# Patient Record
Sex: Male | Born: 1965 | Race: White | Hispanic: No | Marital: Single | Smoking: Never smoker
Health system: Southern US, Community
[De-identification: ages and names within clinical notes are randomized; demographics above are authoritative.]

## PROBLEM LIST (undated history)

## (undated) DIAGNOSIS — J189 Pneumonia, unspecified organism: Secondary | ICD-10-CM

## (undated) HISTORY — PX: ANTERIOR CRUCIATE LIGAMENT REPAIR: SHX115

## (undated) HISTORY — PX: EYE SURGERY: SHX253

---

## 1997-10-31 ENCOUNTER — Emergency Department (HOSPITAL_COMMUNITY): Admission: EM | Admit: 1997-10-31 | Discharge: 1997-10-31 | Payer: Self-pay | Admitting: Emergency Medicine

## 2004-11-20 ENCOUNTER — Emergency Department (HOSPITAL_COMMUNITY): Admission: EM | Admit: 2004-11-20 | Discharge: 2004-11-20 | Payer: Self-pay | Admitting: Emergency Medicine

## 2005-03-04 ENCOUNTER — Emergency Department (HOSPITAL_COMMUNITY): Admission: EM | Admit: 2005-03-04 | Discharge: 2005-03-04 | Payer: Self-pay | Admitting: Emergency Medicine

## 2007-04-19 ENCOUNTER — Emergency Department (HOSPITAL_COMMUNITY): Admission: EM | Admit: 2007-04-19 | Discharge: 2007-04-19 | Payer: Self-pay | Admitting: Emergency Medicine

## 2011-02-16 LAB — I-STAT 8, (EC8 V) (CONVERTED LAB)
BUN: 10
Bicarbonate: 26 — ABNORMAL HIGH
Chloride: 109
Glucose, Bld: 93
HCT: 51
Hemoglobin: 17.3 — ABNORMAL HIGH
Operator id: 234501
Potassium: 4.2
Sodium: 142
TCO2: 27
pCO2, Ven: 44 — ABNORMAL LOW
pH, Ven: 7.379 — ABNORMAL HIGH

## 2011-02-16 LAB — POCT I-STAT CREATININE
Creatinine, Ser: 1
Operator id: 234501

## 2016-03-21 ENCOUNTER — Emergency Department (HOSPITAL_COMMUNITY): Payer: No Typology Code available for payment source

## 2016-03-21 ENCOUNTER — Inpatient Hospital Stay (HOSPITAL_COMMUNITY): Payer: No Typology Code available for payment source

## 2016-03-21 ENCOUNTER — Encounter (HOSPITAL_COMMUNITY): Payer: Self-pay | Admitting: Neurology

## 2016-03-21 ENCOUNTER — Encounter (HOSPITAL_COMMUNITY): Admission: EM | Disposition: A | Discharge status: Rehabilitation Facility | Payer: Self-pay | Source: Home / Self Care

## 2016-03-21 ENCOUNTER — Inpatient Hospital Stay (HOSPITAL_COMMUNITY)
Admission: EM | Admit: 2016-03-21 | Discharge: 2016-04-01 | DRG: 957 | Disposition: A | Discharge status: Rehabilitation Facility | Payer: No Typology Code available for payment source | Attending: General Surgery | Admitting: General Surgery

## 2016-03-21 ENCOUNTER — Inpatient Hospital Stay (HOSPITAL_COMMUNITY): Payer: No Typology Code available for payment source | Admitting: Certified Registered"

## 2016-03-21 DIAGNOSIS — S0285XA Fracture of orbit, unspecified, initial encounter for closed fracture: Secondary | ICD-10-CM | POA: Diagnosis present

## 2016-03-21 DIAGNOSIS — K661 Hemoperitoneum: Secondary | ICD-10-CM | POA: Diagnosis present

## 2016-03-21 DIAGNOSIS — S82401B Unspecified fracture of shaft of right fibula, initial encounter for open fracture type I or II: Secondary | ICD-10-CM

## 2016-03-21 DIAGNOSIS — S0231XS Fracture of orbital floor, right side, sequela: Secondary | ICD-10-CM | POA: Diagnosis not present

## 2016-03-21 DIAGNOSIS — H532 Diplopia: Secondary | ICD-10-CM | POA: Diagnosis present

## 2016-03-21 DIAGNOSIS — G8918 Other acute postprocedural pain: Secondary | ICD-10-CM

## 2016-03-21 DIAGNOSIS — S36409A Unspecified injury of unspecified part of small intestine, initial encounter: Secondary | ICD-10-CM | POA: Diagnosis present

## 2016-03-21 DIAGNOSIS — M62838 Other muscle spasm: Secondary | ICD-10-CM | POA: Diagnosis present

## 2016-03-21 DIAGNOSIS — E876 Hypokalemia: Secondary | ICD-10-CM | POA: Diagnosis present

## 2016-03-21 DIAGNOSIS — R571 Hypovolemic shock: Secondary | ICD-10-CM | POA: Diagnosis present

## 2016-03-21 DIAGNOSIS — R609 Edema, unspecified: Secondary | ICD-10-CM | POA: Diagnosis not present

## 2016-03-21 DIAGNOSIS — S82871D Displaced pilon fracture of right tibia, subsequent encounter for closed fracture with routine healing: Secondary | ICD-10-CM | POA: Diagnosis not present

## 2016-03-21 DIAGNOSIS — Z6841 Body Mass Index (BMI) 40.0 and over, adult: Secondary | ICD-10-CM | POA: Diagnosis not present

## 2016-03-21 DIAGNOSIS — L299 Pruritus, unspecified: Secondary | ICD-10-CM | POA: Diagnosis present

## 2016-03-21 DIAGNOSIS — T1490XA Injury, unspecified, initial encounter: Secondary | ICD-10-CM | POA: Diagnosis not present

## 2016-03-21 DIAGNOSIS — S82871A Displaced pilon fracture of right tibia, initial encounter for closed fracture: Secondary | ICD-10-CM

## 2016-03-21 DIAGNOSIS — D62 Acute posthemorrhagic anemia: Secondary | ICD-10-CM | POA: Diagnosis present

## 2016-03-21 DIAGNOSIS — K567 Ileus, unspecified: Secondary | ICD-10-CM | POA: Diagnosis present

## 2016-03-21 DIAGNOSIS — S0231XA Fracture of orbital floor, right side, initial encounter for closed fracture: Secondary | ICD-10-CM | POA: Diagnosis present

## 2016-03-21 DIAGNOSIS — S01111A Laceration without foreign body of right eyelid and periocular area, initial encounter: Secondary | ICD-10-CM | POA: Diagnosis present

## 2016-03-21 DIAGNOSIS — S0230XD Fracture of orbital floor, unspecified side, subsequent encounter for fracture with routine healing: Secondary | ICD-10-CM | POA: Diagnosis present

## 2016-03-21 DIAGNOSIS — Z9889 Other specified postprocedural states: Secondary | ICD-10-CM

## 2016-03-21 DIAGNOSIS — S36113A Laceration of liver, unspecified degree, initial encounter: Secondary | ICD-10-CM | POA: Diagnosis present

## 2016-03-21 DIAGNOSIS — S82871S Displaced pilon fracture of right tibia, sequela: Secondary | ICD-10-CM | POA: Diagnosis present

## 2016-03-21 DIAGNOSIS — S82401E Unspecified fracture of shaft of right fibula, subsequent encounter for open fracture type I or II with routine healing: Secondary | ICD-10-CM | POA: Diagnosis not present

## 2016-03-21 DIAGNOSIS — Z87891 Personal history of nicotine dependence: Secondary | ICD-10-CM | POA: Diagnosis not present

## 2016-03-21 DIAGNOSIS — S36113D Laceration of liver, unspecified degree, subsequent encounter: Secondary | ICD-10-CM | POA: Diagnosis not present

## 2016-03-21 DIAGNOSIS — S82831B Other fracture of upper and lower end of right fibula, initial encounter for open fracture type I or II: Secondary | ICD-10-CM | POA: Diagnosis present

## 2016-03-21 DIAGNOSIS — R0902 Hypoxemia: Secondary | ICD-10-CM | POA: Diagnosis present

## 2016-03-21 DIAGNOSIS — Z967 Presence of other bone and tendon implants: Secondary | ICD-10-CM | POA: Diagnosis not present

## 2016-03-21 DIAGNOSIS — L282 Other prurigo: Secondary | ICD-10-CM

## 2016-03-21 DIAGNOSIS — R05 Cough: Secondary | ICD-10-CM

## 2016-03-21 DIAGNOSIS — Z8781 Personal history of (healed) traumatic fracture: Secondary | ICD-10-CM | POA: Diagnosis not present

## 2016-03-21 DIAGNOSIS — S82871B Displaced pilon fracture of right tibia, initial encounter for open fracture type I or II: Secondary | ICD-10-CM | POA: Diagnosis present

## 2016-03-21 DIAGNOSIS — S35229A Unspecified injury of superior mesenteric artery, initial encounter: Secondary | ICD-10-CM | POA: Diagnosis present

## 2016-03-21 DIAGNOSIS — S82201B Unspecified fracture of shaft of right tibia, initial encounter for open fracture type I or II: Secondary | ICD-10-CM

## 2016-03-21 DIAGNOSIS — S0511XD Contusion of eyeball and orbital tissues, right eye, subsequent encounter: Secondary | ICD-10-CM | POA: Diagnosis not present

## 2016-03-21 DIAGNOSIS — R059 Cough, unspecified: Secondary | ICD-10-CM

## 2016-03-21 DIAGNOSIS — Z419 Encounter for procedure for purposes other than remedying health state, unspecified: Secondary | ICD-10-CM

## 2016-03-21 DIAGNOSIS — R030 Elevated blood-pressure reading, without diagnosis of hypertension: Secondary | ICD-10-CM | POA: Diagnosis present

## 2016-03-21 DIAGNOSIS — L03316 Cellulitis of umbilicus: Secondary | ICD-10-CM | POA: Diagnosis present

## 2016-03-21 DIAGNOSIS — L0390 Cellulitis, unspecified: Secondary | ICD-10-CM

## 2016-03-21 DIAGNOSIS — T8189XD Other complications of procedures, not elsewhere classified, subsequent encounter: Secondary | ICD-10-CM | POA: Diagnosis not present

## 2016-03-21 DIAGNOSIS — L03818 Cellulitis of other sites: Secondary | ICD-10-CM | POA: Diagnosis not present

## 2016-03-21 HISTORY — PX: I&D EXTREMITY: SHX5045

## 2016-03-21 HISTORY — PX: BOWEL RESECTION: SHX1257

## 2016-03-21 HISTORY — PX: LAPAROTOMY: SHX154

## 2016-03-21 HISTORY — PX: EXTERNAL FIXATION LEG: SHX1549

## 2016-03-21 LAB — COMPREHENSIVE METABOLIC PANEL
ALT: 55 U/L (ref 17–63)
AST: 50 U/L — ABNORMAL HIGH (ref 15–41)
Albumin: 3.9 g/dL (ref 3.5–5.0)
Alkaline Phosphatase: 64 U/L (ref 38–126)
Anion gap: 12 (ref 5–15)
BUN: 11 mg/dL (ref 6–20)
CO2: 24 mmol/L (ref 22–32)
Calcium: 9 mg/dL (ref 8.9–10.3)
Chloride: 104 mmol/L (ref 101–111)
Creatinine, Ser: 1.08 mg/dL (ref 0.61–1.24)
GFR calc Af Amer: >60 mL/min (ref >60)
GFR calc non Af Amer: >60 mL/min (ref >60)
GLUCOSE: 132 mg/dL — AB (ref 65–99)
POTASSIUM: 3.1 mmol/L — AB (ref 3.5–5.1)
Sodium: 140 mmol/L (ref 135–145)
Total Bilirubin: 0.7 mg/dL (ref 0.3–1.2)
Total Protein: 7 g/dL (ref 6.5–8.1)

## 2016-03-21 LAB — I-STAT CHEM 8, ED
BUN: 13 mg/dL (ref 6–20)
CHLORIDE: 103 mmol/L (ref 101–111)
Calcium, Ion: 1.11 mmol/L — ABNORMAL LOW (ref 1.15–1.40)
Creatinine, Ser: 0.9 mg/dL (ref 0.61–1.24)
Glucose, Bld: 125 mg/dL — ABNORMAL HIGH (ref 65–99)
HCT: 45 % (ref 39.0–52.0)
Hemoglobin: 15.3 g/dL (ref 13.0–17.0)
Potassium: 3.1 mmol/L — ABNORMAL LOW (ref 3.5–5.1)
Sodium: 142 mmol/L (ref 135–145)
TCO2: 24 mmol/L (ref 0–100)

## 2016-03-21 LAB — POCT I-STAT 7, (LYTES, BLD GAS, ICA,H+H)
ACID-BASE DEFICIT: 10 mmol/L — AB (ref 0.0–2.0)
ACID-BASE DEFICIT: 4 mmol/L — AB (ref 0.0–2.0)
BICARBONATE: 18.8 mmol/L — AB (ref 20.0–28.0)
BICARBONATE: 23.4 mmol/L (ref 20.0–28.0)
CALCIUM ION: 1 mmol/L — AB (ref 1.15–1.40)
Calcium, Ion: 1.07 mmol/L — ABNORMAL LOW (ref 1.15–1.40)
HCT: 30 % — ABNORMAL LOW (ref 39.0–52.0)
HCT: 26 % — ABNORMAL LOW (ref 39.0–52.0)
Hemoglobin: 10.2 g/dL — ABNORMAL LOW (ref 13.0–17.0)
Hemoglobin: 8.8 g/dL — ABNORMAL LOW (ref 13.0–17.0)
O2 SAT: 100 %
O2 Saturation: 100 %
PCO2 ART: 55.7 mmHg — AB (ref 32.0–48.0)
PH ART: 7.138 — AB (ref 7.350–7.450)
PH ART: 7.265 — AB (ref 7.350–7.450)
PO2 ART: 345 mmHg — AB (ref 83.0–108.0)
Potassium: 4 mmol/L (ref 3.5–5.1)
Potassium: 4.1 mmol/L (ref 3.5–5.1)
SODIUM: 142 mmol/L (ref 135–145)
Sodium: 140 mmol/L (ref 135–145)
TCO2: 21 mmol/L (ref 0–100)
TCO2: 25 mmol/L (ref 0–100)
pCO2 arterial: 50.8 mmHg — ABNORMAL HIGH (ref 32.0–48.0)
pO2, Arterial: 359 mmHg — ABNORMAL HIGH (ref 83.0–108.0)

## 2016-03-21 LAB — CDS SEROLOGY

## 2016-03-21 LAB — CBC
HCT: 43.4 % (ref 39.0–52.0)
HEMOGLOBIN: 14.7 g/dL (ref 13.0–17.0)
MCH: 30.8 pg (ref 26.0–34.0)
MCHC: 33.9 g/dL (ref 30.0–36.0)
MCV: 90.8 fL (ref 78.0–100.0)
Platelets: 341 10*3/uL (ref 150–400)
RBC: 4.78 MIL/uL (ref 4.22–5.81)
RDW: 13.5 % (ref 11.5–15.5)
WBC: 15.3 10*3/uL — AB (ref 4.0–10.5)

## 2016-03-21 LAB — HEMOGLOBIN AND HEMATOCRIT, BLOOD
HCT: 33.1 % — ABNORMAL LOW (ref 39.0–52.0)
Hemoglobin: 11.1 g/dL — ABNORMAL LOW (ref 13.0–17.0)

## 2016-03-21 LAB — PROTIME-INR
INR: 1.01
Prothrombin Time: 13.3 seconds (ref 11.4–15.2)

## 2016-03-21 LAB — I-STAT CG4 LACTIC ACID, ED: Lactic Acid, Venous: 5.23 mmol/L (ref 0.5–1.9)

## 2016-03-21 LAB — ABO/RH: ABO/RH(D): A POS

## 2016-03-21 SURGERY — LAPAROTOMY, EXPLORATORY
Anesthesia: General | Site: Leg Lower | Laterality: Right

## 2016-03-21 MED ORDER — CEFAZOLIN SODIUM-DEXTROSE 2-4 GM/100ML-% IV SOLN
2.0000 g | Freq: Once | INTRAVENOUS | Status: AC
  Administered 2016-03-21: 2 g via INTRAVENOUS

## 2016-03-21 MED ORDER — ONDANSETRON HCL 4 MG/2ML IJ SOLN
INTRAMUSCULAR | Status: AC
  Filled 2016-03-21: qty 2

## 2016-03-21 MED ORDER — CEFAZOLIN IN D5W 1 GM/50ML IV SOLN
1.0000 g | Freq: Four times a day (QID) | INTRAVENOUS | Status: DC
  Administered 2016-03-22 – 2016-03-27 (×20): 1 g via INTRAVENOUS
  Filled 2016-03-21 (×25): qty 50

## 2016-03-21 MED ORDER — HYDROMORPHONE 1 MG/ML IV SOLN
INTRAVENOUS | Status: AC
  Filled 2016-03-21: qty 25

## 2016-03-21 MED ORDER — ETOMIDATE 2 MG/ML IV SOLN
INTRAVENOUS | Status: DC | PRN
  Administered 2016-03-21: 20 mg via INTRAVENOUS

## 2016-03-21 MED ORDER — SUCCINYLCHOLINE CHLORIDE 200 MG/10ML IV SOSY
PREFILLED_SYRINGE | INTRAVENOUS | Status: AC
  Filled 2016-03-21: qty 10

## 2016-03-21 MED ORDER — GLYCOPYRROLATE 0.2 MG/ML IJ SOLN
INTRAMUSCULAR | Status: DC | PRN
  Administered 2016-03-21: .6 mg via INTRAVENOUS

## 2016-03-21 MED ORDER — DIPHENHYDRAMINE HCL 12.5 MG/5ML PO ELIX: 12.5000 mg | ORAL_SOLUTION | Freq: Four times a day (QID) | ORAL | Status: DC | PRN

## 2016-03-21 MED ORDER — FENTANYL CITRATE (PF) 100 MCG/2ML IJ SOLN
INTRAMUSCULAR | Status: AC
  Filled 2016-03-21: qty 2

## 2016-03-21 MED ORDER — CEFAZOLIN IN D5W 1 GM/50ML IV SOLN
INTRAVENOUS | Status: DC | PRN
  Administered 2016-03-21: 1 g via INTRAVENOUS

## 2016-03-21 MED ORDER — LIDOCAINE 2% (20 MG/ML) 5 ML SYRINGE
INTRAMUSCULAR | Status: AC
  Filled 2016-03-21: qty 5

## 2016-03-21 MED ORDER — HYDROMORPHONE 1 MG/ML IV SOLN
INTRAVENOUS | Status: DC
Start: 2016-03-22 — End: 2016-03-24
  Administered 2016-03-22: 1.2 mg via INTRAVENOUS
  Administered 2016-03-22: 1.8 mg via INTRAVENOUS
  Administered 2016-03-22 (×2): 0.3 mg via INTRAVENOUS
  Administered 2016-03-22: 3.7 mg via INTRAVENOUS
  Administered 2016-03-22: 0.3 mg via INTRAVENOUS
  Administered 2016-03-22: 1.8 mg via INTRAVENOUS
  Administered 2016-03-23: 1.5 mg via INTRAVENOUS
  Administered 2016-03-23: 0 mg via INTRAVENOUS
  Administered 2016-03-23: 0.3 mg via INTRAVENOUS
  Administered 2016-03-23: 1.8 mg via INTRAVENOUS
  Administered 2016-03-23: via INTRAVENOUS
  Administered 2016-03-23: 0.8 mg via INTRAVENOUS
  Administered 2016-03-24: 1.2 mg via INTRAVENOUS
  Administered 2016-03-24: 0.3 mg via INTRAVENOUS
  Filled 2016-03-21: qty 25

## 2016-03-21 MED ORDER — NEOSTIGMINE METHYLSULFATE 5 MG/5ML IV SOSY
PREFILLED_SYRINGE | INTRAVENOUS | Status: AC
  Filled 2016-03-21: qty 5

## 2016-03-21 MED ORDER — ACETAMINOPHEN 325 MG PO TABS: 650.0000 mg | ORAL_TABLET | ORAL | Status: DC | PRN

## 2016-03-21 MED ORDER — SODIUM BICARBONATE 8.4 % IV SOLN
INTRAVENOUS | Status: AC
  Filled 2016-03-21: qty 50

## 2016-03-21 MED ORDER — NALOXONE HCL 0.4 MG/ML IJ SOLN: 0.4000 mg | INTRAMUSCULAR | Status: DC | PRN

## 2016-03-21 MED ORDER — SODIUM CHLORIDE 0.9 % IV BOLUS (SEPSIS)
1000.0000 mL | Freq: Once | INTRAVENOUS | Status: AC
  Administered 2016-03-21: 1000 mL via INTRAVENOUS

## 2016-03-21 MED ORDER — HYDROMORPHONE HCL 2 MG/ML IJ SOLN
1.0000 mg | Freq: Once | INTRAMUSCULAR | Status: AC
  Administered 2016-03-21: 1 mg via INTRAVENOUS
  Filled 2016-03-21: qty 1

## 2016-03-21 MED ORDER — IOPAMIDOL (ISOVUE-300) INJECTION 61%
INTRAVENOUS | Status: AC
  Administered 2016-03-21: 100 mL
  Filled 2016-03-21: qty 100

## 2016-03-21 MED ORDER — ONDANSETRON HCL 4 MG PO TABS: 4.0000 mg | ORAL_TABLET | Freq: Four times a day (QID) | ORAL | Status: DC | PRN

## 2016-03-21 MED ORDER — SODIUM CHLORIDE 0.9 % IR SOLN
Status: DC | PRN
  Administered 2016-03-21: 3000 mL

## 2016-03-21 MED ORDER — ALBUMIN HUMAN 5 % IV SOLN
INTRAVENOUS | Status: DC | PRN
  Administered 2016-03-21: 23:00:00 via INTRAVENOUS

## 2016-03-21 MED ORDER — ONDANSETRON HCL 4 MG/2ML IJ SOLN
INTRAMUSCULAR | Status: DC | PRN
  Administered 2016-03-21: 4 mg via INTRAVENOUS

## 2016-03-21 MED ORDER — DOCUSATE SODIUM 100 MG PO CAPS
100.0000 mg | ORAL_CAPSULE | Freq: Two times a day (BID) | ORAL | Status: DC
  Administered 2016-03-22 – 2016-03-25 (×6): 100 mg via ORAL
  Filled 2016-03-21 (×7): qty 1

## 2016-03-21 MED ORDER — PROMETHAZINE HCL 25 MG/ML IJ SOLN
6.2500 mg | INTRAMUSCULAR | Status: DC | PRN
Start: 2016-03-21 — End: 2016-03-22
  Administered 2016-03-22: 6.25 mg via INTRAVENOUS

## 2016-03-21 MED ORDER — ETOMIDATE 2 MG/ML IV SOLN
INTRAVENOUS | Status: AC
  Filled 2016-03-21: qty 10

## 2016-03-21 MED ORDER — SODIUM CHLORIDE 0.9 % IV SOLN: INTRAVENOUS | Status: DC | PRN

## 2016-03-21 MED ORDER — GLYCOPYRROLATE 0.2 MG/ML IV SOSY
PREFILLED_SYRINGE | INTRAVENOUS | Status: AC
  Filled 2016-03-21: qty 3

## 2016-03-21 MED ORDER — SODIUM CHLORIDE 0.9 % IV SOLN
INTRAVENOUS | Status: DC | PRN
Start: 2016-03-21 — End: 2016-03-21
  Administered 2016-03-21: 21:00:00 via INTRAVENOUS

## 2016-03-21 MED ORDER — CEFAZOLIN SODIUM 1 G IJ SOLR
INTRAMUSCULAR | Status: AC
  Filled 2016-03-21: qty 30

## 2016-03-21 MED ORDER — LIDOCAINE HCL (CARDIAC) 20 MG/ML IV SOLN
INTRAVENOUS | Status: DC | PRN
  Administered 2016-03-21: 100 mg via INTRATRACHEAL

## 2016-03-21 MED ORDER — NEOSTIGMINE METHYLSULFATE 10 MG/10ML IV SOLN
INTRAVENOUS | Status: DC | PRN
  Administered 2016-03-21: 5 mg via INTRAVENOUS

## 2016-03-21 MED ORDER — SUCCINYLCHOLINE CHLORIDE 20 MG/ML IJ SOLN
INTRAMUSCULAR | Status: DC | PRN
  Administered 2016-03-21: 120 mg via INTRAVENOUS

## 2016-03-21 MED ORDER — DIPHENHYDRAMINE HCL 50 MG/ML IJ SOLN
12.5000 mg | Freq: Four times a day (QID) | INTRAMUSCULAR | Status: DC | PRN
  Administered 2016-03-22: 12.5 mg via INTRAVENOUS
  Filled 2016-03-21: qty 1

## 2016-03-21 MED ORDER — TETANUS-DIPHTH-ACELL PERTUSSIS 5-2.5-18.5 LF-MCG/0.5 IM SUSP
0.5000 mL | Freq: Once | INTRAMUSCULAR | Status: AC
  Administered 2016-03-21: 0.5 mL via INTRAMUSCULAR

## 2016-03-21 MED ORDER — 0.9 % SODIUM CHLORIDE (POUR BTL) OPTIME
TOPICAL | Status: DC | PRN
  Administered 2016-03-21: 5000 mL
  Administered 2016-03-21: 1000 mL

## 2016-03-21 MED ORDER — SODIUM CHLORIDE 0.9 % IV BOLUS (SEPSIS)
1000.0000 mL | Freq: Once | INTRAVENOUS | Status: AC
Start: 2016-03-21 — End: 2016-03-21
  Administered 2016-03-21: 1000 mL via INTRAVENOUS

## 2016-03-21 MED ORDER — MORPHINE SULFATE (PF) 4 MG/ML IV SOLN: 2.0000 mg | INTRAVENOUS | Status: DC | PRN

## 2016-03-21 MED ORDER — CEFAZOLIN SODIUM-DEXTROSE 2-4 GM/100ML-% IV SOLN
INTRAVENOUS | Status: AC
  Filled 2016-03-21: qty 100

## 2016-03-21 MED ORDER — LACTATED RINGERS IV SOLN
INTRAVENOUS | Status: DC | PRN
  Administered 2016-03-21 (×2): via INTRAVENOUS

## 2016-03-21 MED ORDER — PROPOFOL 10 MG/ML IV BOLUS
INTRAVENOUS | Status: AC
  Filled 2016-03-21: qty 20

## 2016-03-21 MED ORDER — ONDANSETRON HCL 4 MG/2ML IJ SOLN: 4.0000 mg | Freq: Four times a day (QID) | INTRAMUSCULAR | Status: DC | PRN

## 2016-03-21 MED ORDER — SODIUM BICARBONATE 8.4 % IV SOLN
INTRAVENOUS | Status: DC | PRN
  Administered 2016-03-21: 50 meq via INTRAVENOUS

## 2016-03-21 MED ORDER — TETANUS-DIPHTH-ACELL PERTUSSIS 5-2.5-18.5 LF-MCG/0.5 IM SUSP
INTRAMUSCULAR | Status: AC
  Filled 2016-03-21: qty 0.5

## 2016-03-21 MED ORDER — FENTANYL CITRATE (PF) 100 MCG/2ML IJ SOLN
INTRAMUSCULAR | Status: DC | PRN
  Administered 2016-03-21 (×4): 50 ug via INTRAVENOUS

## 2016-03-21 MED ORDER — POVIDONE-IODINE 10 % OINT PACKET
TOPICAL_OINTMENT | CUTANEOUS | Status: DC | PRN
  Administered 2016-03-21: 1 via TOPICAL

## 2016-03-21 MED ORDER — POVIDONE-IODINE 10 % EX OINT
TOPICAL_OINTMENT | CUTANEOUS | Status: AC
  Filled 2016-03-21: qty 28.35

## 2016-03-21 MED ORDER — SODIUM CHLORIDE 0.9% FLUSH: 9.0000 mL | INTRAVENOUS | Status: DC | PRN

## 2016-03-21 MED ORDER — ROCURONIUM BROMIDE 100 MG/10ML IV SOLN
INTRAVENOUS | Status: DC | PRN
  Administered 2016-03-21: 50 mg via INTRAVENOUS

## 2016-03-21 MED ORDER — ROCURONIUM BROMIDE 10 MG/ML (PF) SYRINGE
PREFILLED_SYRINGE | INTRAVENOUS | Status: AC
  Filled 2016-03-21: qty 10

## 2016-03-21 MED ORDER — FENTANYL CITRATE (PF) 100 MCG/2ML IJ SOLN
25.0000 ug | INTRAMUSCULAR | Status: DC | PRN
  Administered 2016-03-21 (×2): 50 ug via INTRAVENOUS

## 2016-03-21 SURGICAL SUPPLY — 64 items
BANDAGE ACE 4X5 VEL STRL LF (GAUZE/BANDAGES/DRESSINGS) ×4 IMPLANT
BANDAGE ACE 6X5 VEL STRL LF (GAUZE/BANDAGES/DRESSINGS) ×4 IMPLANT
BAR GLASS FIBER EXFX 11X300 (EXFIX) ×8 IMPLANT
BLADE SURG ROTATE 9660 (MISCELLANEOUS) IMPLANT
CANISTER SUCTION 2500CC (MISCELLANEOUS) ×4 IMPLANT
CHLORAPREP W/TINT 26ML (MISCELLANEOUS) ×4 IMPLANT
CLAMP BLUE BAR TO PIN (EXFIX) ×8 IMPLANT
CLAMP PIN 2 BAR 45MM EXFIX (EXFIX) ×4 IMPLANT
COVER SURGICAL LIGHT HANDLE (MISCELLANEOUS) ×4 IMPLANT
DRAPE C-ARMOR (DRAPES) ×4 IMPLANT
DRAPE LAPAROSCOPIC ABDOMINAL (DRAPES) ×12 IMPLANT
DRAPE WARM FLUID 44X44 (DRAPE) ×4 IMPLANT
DRSG MEPILEX BORDER 4X4 (GAUZE/BANDAGES/DRESSINGS) ×4 IMPLANT
DRSG OPSITE POSTOP 4X10 (GAUZE/BANDAGES/DRESSINGS) ×4 IMPLANT
DRSG OPSITE POSTOP 4X8 (GAUZE/BANDAGES/DRESSINGS) ×4 IMPLANT
ELECT BLADE 6.5 EXT (BLADE) IMPLANT
ELECT CAUTERY BLADE 6.4 (BLADE) ×4 IMPLANT
ELECT REM PT RETURN 9FT ADLT (ELECTROSURGICAL) ×4
ELECTRODE REM PT RTRN 9FT ADLT (ELECTROSURGICAL) ×2 IMPLANT
GAUZE XEROFORM 5X9 LF (GAUZE/BANDAGES/DRESSINGS) ×4 IMPLANT
GLOVE BIO SURGEON STRL SZ7.5 (GLOVE) ×8 IMPLANT
GLOVE BIOGEL PI IND STRL 6.5 (GLOVE) ×6 IMPLANT
GLOVE BIOGEL PI IND STRL 8 (GLOVE) ×4 IMPLANT
GLOVE BIOGEL PI INDICATOR 6.5 (GLOVE) ×6
GLOVE BIOGEL PI INDICATOR 8 (GLOVE) ×4
GLOVE ECLIPSE 7.5 STRL STRAW (GLOVE) ×8 IMPLANT
GOWN STRL REUS W/ TWL LRG LVL3 (GOWN DISPOSABLE) ×4 IMPLANT
GOWN STRL REUS W/TWL LRG LVL3 (GOWN DISPOSABLE) ×4
HANDPIECE INTERPULSE COAX TIP (DISPOSABLE) ×2
KIT BASIN OR (CUSTOM PROCEDURE TRAY) ×4 IMPLANT
KIT ROOM TURNOVER OR (KITS) ×4 IMPLANT
NS IRRIG 1000ML POUR BTL (IV SOLUTION) ×8 IMPLANT
PACK GENERAL/GYN (CUSTOM PROCEDURE TRAY) ×4 IMPLANT
PACK ORTHO EXTREMITY (CUSTOM PROCEDURE TRAY) ×4 IMPLANT
PAD ARMBOARD 7.5X6 YLW CONV (MISCELLANEOUS) ×4 IMPLANT
PAD CAST 4YDX4 CTTN HI CHSV (CAST SUPPLIES) ×4 IMPLANT
PADDING CAST COTTON 4X4 STRL (CAST SUPPLIES) ×4
PIN HALF YELLOW 5X160X35 (EXFIX) ×8 IMPLANT
PIN TRANSFIXING 5.0 (EXFIX) ×4 IMPLANT
RELOAD PROXIMATE 75MM BLUE (ENDOMECHANICALS) ×8 IMPLANT
SEALER TISSUE X1 CVD JAW (INSTRUMENTS) IMPLANT
SEPRAFILM PROCEDURAL PACK 3X5 (MISCELLANEOUS) IMPLANT
SET HNDPC FAN SPRY TIP SCT (DISPOSABLE) ×2 IMPLANT
SPECIMEN JAR LARGE (MISCELLANEOUS) IMPLANT
SPONGE GAUZE 4X4 12PLY STER LF (GAUZE/BANDAGES/DRESSINGS) ×4 IMPLANT
SPONGE LAP 18X18 X RAY DECT (DISPOSABLE) ×24 IMPLANT
STAPLER GUN LINEAR PROX 60 (STAPLE) ×4 IMPLANT
STAPLER PROXIMATE 75MM BLUE (STAPLE) ×4 IMPLANT
STAPLER VISISTAT 35W (STAPLE) ×4 IMPLANT
SUCTION POOLE TIP (SUCTIONS) ×4 IMPLANT
SUT ETHILON 2 0 FS 18 (SUTURE) ×8 IMPLANT
SUT NOVA 1 T20/GS 25DT (SUTURE) IMPLANT
SUT PDS AB 1 TP1 96 (SUTURE) ×16 IMPLANT
SUT SILK 2 0 SH CR/8 (SUTURE) ×4 IMPLANT
SUT SILK 2 0 TIES 10X30 (SUTURE) ×4 IMPLANT
SUT SILK 3 0 SH CR/8 (SUTURE) ×4 IMPLANT
SUT SILK 3 0 TIES 10X30 (SUTURE) ×4 IMPLANT
TOWEL OR 17X24 6PK STRL BLUE (TOWEL DISPOSABLE) ×8 IMPLANT
TOWEL OR 17X26 10 PK STRL BLUE (TOWEL DISPOSABLE) ×4 IMPLANT
TRAY FOLEY CATH 16FRSI W/METER (SET/KITS/TRAYS/PACK) IMPLANT
TRAY FOLEY W/METER SILVER 16FR (SET/KITS/TRAYS/PACK) ×4 IMPLANT
TUBE CONNECTING 12'X1/4 (SUCTIONS) ×1
TUBE CONNECTING 12X1/4 (SUCTIONS) ×3 IMPLANT
YANKAUER SUCT BULB TIP NO VENT (SUCTIONS) ×4 IMPLANT

## 2016-03-21 NOTE — ED Notes (Signed)
Received verbal order from MD Kinsinger for immediate infusion of 1 unit RBCs.

## 2016-03-21 NOTE — ED Notes (Signed)
1st unit RBC finished via Pressure bag.
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
## 2016-03-21 NOTE — ED Notes (Signed)
Dr. Silverio LayYao and Dr. Ladona RidgelGaddy made aware of drop in BP. Will be activating Level 1 trauma.
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
## 2016-03-21 NOTE — Transfer of Care (Signed)
Immediate Anesthesia Transfer of Care Note  Patient: Gregory Jordan  Procedure(s) Performed: Procedure(s): EXPLORATORY LAPAROTOMY (N/A) IRRIGATION AND DEBRIDEMENT RIGHT TIBIA (Right) EXTERNAL FIXATION LEG (Right) SMALL BOWEL RESECTION (N/A)  Patient Location: PACU  Anesthesia Type:General  Level of Consciousness: alert , sedated and patient cooperative  Airway & Oxygen Therapy: Patient connected to nasal cannula oxygen  Post-op Assessment: Report given to RN and Post -op Vital signs reviewed and stable  Post vital signs: Reviewed and stable  Last Vitals:  Vitals:   03/21/16 2010 03/21/16 2015  BP: 115/56 108/59  Pulse: 106 102  Resp: 18 15  Temp:      Last Pain:  Vitals:   03/21/16 1800  TempSrc: Oral  PainSc: 10-Worst pain ever         Complications: No apparent anesthesia complications
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
## 2016-03-21 NOTE — ED Notes (Signed)
1st Unit RBCs started via pressure bag.

## 2016-03-21 NOTE — Anesthesia Procedure Notes (Signed)
Procedure Name: Intubation Date/Time: 03/21/2016 8:37 PM Performed by: Molli HazardGORDON, Kizer Nobbe M Pre-anesthesia Checklist: Patient identified, Emergency Drugs available, Suction available and Patient being monitored Patient Re-evaluated:Patient Re-evaluated prior to inductionOxygen Delivery Method: Circle system utilized Preoxygenation: Pre-oxygenation with 100% oxygen Intubation Type: IV induction, Rapid sequence and Cricoid Pressure applied Laryngoscope Size: Glidescope Grade View: Grade I Tube type: Subglottic suction tube Tube size: 7.5 mm Number of attempts: 1 Airway Equipment and Method: Stylet Placement Confirmation: ETT inserted through vocal cords under direct vision,  positive ETCO2 and breath sounds checked- equal and bilateral Secured at: 24 cm Tube secured with: Tape Dental Injury: Teeth and Oropharynx as per pre-operative assessment  Comments: Cervical collar removed for intubation; head/neck kept neutral during laryngoscopy and intubation. Cervical collar replaced after intubation. Noted during laryngoscopy tongue bruised and swollen.
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
## 2016-03-22 ENCOUNTER — Encounter (HOSPITAL_COMMUNITY): Payer: Self-pay | Admitting: Anesthesiology

## 2016-03-22 ENCOUNTER — Inpatient Hospital Stay (HOSPITAL_COMMUNITY): Payer: No Typology Code available for payment source

## 2016-03-22 ENCOUNTER — Encounter (HOSPITAL_COMMUNITY): Admission: EM | Disposition: A | Discharge status: Rehabilitation Facility | Payer: Self-pay | Source: Home / Self Care

## 2016-03-22 ENCOUNTER — Encounter (HOSPITAL_COMMUNITY): Payer: Self-pay | Admitting: General Surgery

## 2016-03-22 ENCOUNTER — Ambulatory Visit: Payer: Self-pay | Admitting: Plastic Surgery

## 2016-03-22 DIAGNOSIS — S0231XA Fracture of orbital floor, right side, initial encounter for closed fracture: Secondary | ICD-10-CM

## 2016-03-22 LAB — URINALYSIS, ROUTINE W REFLEX MICROSCOPIC
Bilirubin Urine: NEGATIVE
Glucose, UA: NEGATIVE mg/dL
Hgb urine dipstick: NEGATIVE
Ketones, ur: NEGATIVE mg/dL
Leukocytes, UA: NEGATIVE
Nitrite: NEGATIVE
Protein, ur: NEGATIVE mg/dL
Specific Gravity, Urine: 1.028 (ref 1.005–1.030)
pH: 5.5 (ref 5.0–8.0)

## 2016-03-22 LAB — ETHANOL: Alcohol, Ethyl (B): <5 mg/dL (ref <5)

## 2016-03-22 LAB — HEMOGLOBIN AND HEMATOCRIT, BLOOD
HCT: 27.7 % — ABNORMAL LOW (ref 39.0–52.0)
HCT: 26.9 % — ABNORMAL LOW (ref 39.0–52.0)
HEMATOCRIT: 27.2 % — AB (ref 39.0–52.0)
HEMATOCRIT: 28 % — AB (ref 39.0–52.0)
HEMOGLOBIN: 9.3 g/dL — AB (ref 13.0–17.0)
HEMOGLOBIN: 9.2 g/dL — AB (ref 13.0–17.0)
Hemoglobin: 8.8 g/dL — ABNORMAL LOW (ref 13.0–17.0)
Hemoglobin: 9.5 g/dL — ABNORMAL LOW (ref 13.0–17.0)

## 2016-03-22 LAB — COMPREHENSIVE METABOLIC PANEL
ALBUMIN: 2.8 g/dL — AB (ref 3.5–5.0)
ALT: 35 U/L (ref 17–63)
ANION GAP: 12 (ref 5–15)
AST: 49 U/L — ABNORMAL HIGH (ref 15–41)
Alkaline Phosphatase: 40 U/L (ref 38–126)
BILIRUBIN TOTAL: 0.6 mg/dL (ref 0.3–1.2)
BUN: 11 mg/dL (ref 6–20)
CO2: 19 mmol/L — ABNORMAL LOW (ref 22–32)
Calcium: 7.2 mg/dL — ABNORMAL LOW (ref 8.9–10.3)
Chloride: 110 mmol/L (ref 101–111)
Creatinine, Ser: 1.15 mg/dL (ref 0.61–1.24)
GFR calc Af Amer: >60 mL/min (ref >60)
GFR calc non Af Amer: >60 mL/min (ref >60)
GLUCOSE: 186 mg/dL — AB (ref 65–99)
POTASSIUM: 4.3 mmol/L (ref 3.5–5.1)
SODIUM: 141 mmol/L (ref 135–145)
TOTAL PROTEIN: 4.8 g/dL — AB (ref 6.5–8.1)

## 2016-03-22 LAB — MRSA PCR SCREENING: MRSA by PCR: NEGATIVE

## 2016-03-22 LAB — GLUCOSE, CAPILLARY: Glucose-Capillary: 171 mg/dL — ABNORMAL HIGH (ref 65–99)

## 2016-03-22 SURGERY — CANCELLED PROCEDURE
Anesthesia: General | Laterality: Right

## 2016-03-22 MED ORDER — CHLORHEXIDINE GLUCONATE CLOTH 2 % EX PADS: 6.0000 | MEDICATED_PAD | Freq: Once | CUTANEOUS | Status: DC

## 2016-03-22 MED ORDER — TROPICAMIDE 1 % OP SOLN
1.0000 [drp] | Freq: Once | OPHTHALMIC | Status: AC
  Administered 2016-03-22: 1 [drp] via OPHTHALMIC
  Filled 2016-03-22: qty 2

## 2016-03-22 MED ORDER — FENTANYL 50 MCG/HR TD PT72
100.0000 ug | MEDICATED_PATCH | TRANSDERMAL | Status: DC
  Administered 2016-03-22: 100 ug via TRANSDERMAL

## 2016-03-22 MED ORDER — CEFAZOLIN SODIUM-DEXTROSE 2-4 GM/100ML-% IV SOLN: 2.0000 g | INTRAVENOUS | Status: AC

## 2016-03-22 MED ORDER — PROMETHAZINE HCL 25 MG/ML IJ SOLN
INTRAMUSCULAR | Status: AC
  Filled 2016-03-22: qty 1

## 2016-03-22 MED ORDER — METHOCARBAMOL 1000 MG/10ML IJ SOLN
1000.0000 mg | Freq: Three times a day (TID) | INTRAVENOUS | Status: DC
  Administered 2016-03-22 – 2016-03-23 (×5): 1000 mg via INTRAVENOUS
  Filled 2016-03-22 (×10): qty 10

## 2016-03-22 MED ORDER — WHITE PETROLATUM GEL
Status: AC
  Filled 2016-03-22: qty 1

## 2016-03-22 MED ORDER — PHENOL 1.4 % MT LIQD: 1.0000 | OROMUCOSAL | Status: DC | PRN

## 2016-03-22 MED ORDER — TETRACAINE HCL 0.5 % OP SOLN
1.0000 [drp] | Freq: Once | OPHTHALMIC | Status: AC
  Administered 2016-03-22: 1 [drp] via OPHTHALMIC
  Filled 2016-03-22: qty 2

## 2016-03-22 MED ORDER — PHENYLEPHRINE HCL 2.5 % OP SOLN
1.0000 [drp] | Freq: Once | OPHTHALMIC | Status: AC
  Administered 2016-03-22: 1 [drp] via OPHTHALMIC
  Filled 2016-03-22: qty 2

## 2016-03-22 NOTE — Progress Notes (Signed)
PT Cancellation Note  Patient Details Name: Gregory Jordan MRN: [id number] DOB: 1966/03/26   Cancelled Treatment:    Reason Eval/Treat Not Completed: Medical issues which prohibited therapy.   *MD:  Please write OOB and weightbearing status orders when appropriate for patient.  PT will initiate evaluation at that time.  Thank you.   Vena AustriaDavis, [name] 03/22/2016, 2:30 PM [name], PT, [facility] Acute [facility]hab [facility] Pager [phone]
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
## 2016-03-22 NOTE — Anesthesia Postprocedure Evaluation (Signed)
Anesthesia Post Note  Patient: Gregory Jordan  Procedure(s) Performed: Procedure(s) (LRB): EXPLORATORY LAPAROTOMY (N/A) IRRIGATION AND DEBRIDEMENT RIGHT TIBIA (Right) EXTERNAL FIXATION LEG (Right) SMALL BOWEL RESECTION (N/A)  Patient location during evaluation: PACU Anesthesia Type: General Level of consciousness: awake Pain management: pain level controlled Vital Signs Assessment: post-procedure vital signs reviewed and stable Respiratory status: spontaneous breathing, nonlabored ventilation, respiratory function stable and patient connected to nasal cannula oxygen Cardiovascular status: blood pressure returned to baseline and stable Postop Assessment: no signs of nausea or vomiting Anesthetic complications: no    Last Vitals:  Vitals:   03/21/16 2345 03/22/16 0003  BP: 133/74   Pulse: (!) 101   Resp: 19 16  Temp:      Last Pain:  Vitals:   03/22/16 0003  TempSrc:   PainSc: 6                  Cecile HearingStephen Edward Aloura Matsuoka

## 2016-03-22 NOTE — Progress Notes (Signed)
   Subjective: 1 Day Post-Op Procedure(s) (LRB): EXPLORATORY LAPAROTOMY (N/A) IRRIGATION AND DEBRIDEMENT RIGHT TIBIA (Right) EXTERNAL FIXATION LEG (Right) SMALL BOWEL RESECTION (N/A) Patient reports pain as moderate.    Objective: Vital signs in last 24 hours: Temp:  [97.4 F (36.3 C)-98.7 F (37.1 C)] 98.4 F (36.9 C) (11/12 1134) Pulse Rate:  [89-132] 126 (11/12 1400) Resp:  [12-27] 17 (11/12 1400) BP: (74-135)/(49-96) 126/80 (11/12 1400) SpO2:  [91 %-100 %] 92 % (11/12 1400) Arterial Line BP: (122-155)/(55-74) 123/66 (11/12 0900) Weight:  [275 lb (124.7 kg)-287 lb 0.6 oz (130.2 kg)] 287 lb 0.6 oz (130.2 kg) (11/12 0100)  Intake/Output from previous day: 11/11 0701 - 11/12 0700 In: 8657 [I.V.:5285; Blood:942; NG/GT:30; IV Piggyback:2400] Out: 1750 [Urine:1350; Blood:400] Intake/Output this shift: Total I/O In: 190 [NG/GT:30; IV Piggyback:160] Out: 785 [Urine:685; Emesis/NG output:100]   Recent Labs  03/21/16 2107 03/21/16 2216 03/21/16 2342 03/22/16 0555 03/22/16 1312  HGB 10.2* 8.8* 9.5* 9.3* 9.2*    Recent Labs  03/21/16 1750  03/22/16 0555 03/22/16 1312  WBC 15.3*  --   --   --   RBC 4.78  --   --   --   HCT 43.4  < > 27.2* 27.7*  PLT 341  --   --   --   < > = values in this interval not displayed.  Recent Labs  03/21/16 1750 03/21/16 1811  03/21/16 2216 03/21/16 2342  NA 140 142  < > 142 141  K 3.1* 3.1*  < > 4.1 4.3  CL 104 103  --   --  110  CO2 24  --   --   --  19*  BUN 11 13  --   --  11  CREATININE 1.08 0.90  --   --  1.15  GLUCOSE 132* 125*  --   --  186*  CALCIUM 9.0  --   --   --  7.2*  < > = values in this interval not displayed.  Recent Labs  03/21/16 1750  INR 1.01    answers questions today. more alert.  Dressing removed. Pin sites look good. Compartments soft Assessment/Plan: 1 Day Post-Op Procedure(s) (LRB): EXPLORATORY LAPAROTOMY (N/A) IRRIGATION AND DEBRIDEMENT RIGHT TIBIA (Right) EXTERNAL FIXATION LEG  (Right) SMALL BOWEL RESECTION (N/A) PLAN;   Needs foot elevated since expected surgery and need edema down for cllosure of the skin.     CT of distal tibia ordered for pre-op planning of surgery   Eldred MangesMark C Willeen Novak 03/22/2016, 3:42 PM

## 2016-03-23 LAB — BASIC METABOLIC PANEL
ANION GAP: 5 (ref 5–15)
BUN: 8 mg/dL (ref 6–20)
CALCIUM: 8 mg/dL — AB (ref 8.9–10.3)
CHLORIDE: 106 mmol/L (ref 101–111)
CO2: 27 mmol/L (ref 22–32)
Creatinine, Ser: 0.8 mg/dL (ref 0.61–1.24)
GFR calc Af Amer: >60 mL/min (ref >60)
GFR calc non Af Amer: >60 mL/min (ref >60)
GLUCOSE: 154 mg/dL — AB (ref 65–99)
Potassium: 4.1 mmol/L (ref 3.5–5.1)
Sodium: 138 mmol/L (ref 135–145)

## 2016-03-23 LAB — CBC WITH DIFFERENTIAL/PLATELET
BASOS ABS: 0 10*3/uL (ref 0.0–0.1)
Basophils Relative: 0 %
Eosinophils Absolute: 0 10*3/uL (ref 0.0–0.7)
Eosinophils Relative: 0 %
HEMATOCRIT: 25 % — AB (ref 39.0–52.0)
HEMOGLOBIN: 8.2 g/dL — AB (ref 13.0–17.0)
LYMPHS PCT: 11 %
Lymphs Abs: 1.8 10*3/uL (ref 0.7–4.0)
MCH: 29.5 pg (ref 26.0–34.0)
MCHC: 32.8 g/dL (ref 30.0–36.0)
MCV: 89.9 fL (ref 78.0–100.0)
MONO ABS: 1.9 10*3/uL — AB (ref 0.1–1.0)
MONOS PCT: 12 %
NEUTROS ABS: 12.3 10*3/uL — AB (ref 1.7–7.7)
NEUTROS PCT: 77 %
Platelets: 214 10*3/uL (ref 150–400)
RBC: 2.78 MIL/uL — ABNORMAL LOW (ref 4.22–5.81)
RDW: 14.4 % (ref 11.5–15.5)
WBC: 16 10*3/uL — ABNORMAL HIGH (ref 4.0–10.5)

## 2016-03-23 LAB — PREPARE FRESH FROZEN PLASMA
UNIT DIVISION: 0
Unit division: 0
Unit division: 0
Unit division: 0

## 2016-03-23 LAB — HEMOGLOBIN AND HEMATOCRIT, BLOOD
HEMATOCRIT: 25.2 % — AB (ref 39.0–52.0)
HEMATOCRIT: 25.1 % — AB (ref 39.0–52.0)
HEMOGLOBIN: 8.3 g/dL — AB (ref 13.0–17.0)
Hemoglobin: 8.3 g/dL — ABNORMAL LOW (ref 13.0–17.0)

## 2016-03-23 MED ORDER — POTASSIUM CHLORIDE IN NACL 20-0.45 MEQ/L-% IV SOLN
INTRAVENOUS | Status: DC
  Administered 2016-03-23: 15:00:00 via INTRAVENOUS
  Filled 2016-03-23 (×3): qty 1000

## 2016-03-23 MED ORDER — ORAL CARE MOUTH RINSE
15.0000 mL | Freq: Two times a day (BID) | OROMUCOSAL | Status: DC
  Administered 2016-03-23: 15 mL via OROMUCOSAL

## 2016-03-23 MED ORDER — CHLORHEXIDINE GLUCONATE 0.12 % MT SOLN
15.0000 mL | Freq: Two times a day (BID) | OROMUCOSAL | Status: DC
  Administered 2016-03-23 – 2016-04-01 (×15): 15 mL via OROMUCOSAL
  Filled 2016-03-23 (×16): qty 15

## 2016-03-23 MED ORDER — ORAL CARE MOUTH RINSE
15.0000 mL | Freq: Two times a day (BID) | OROMUCOSAL | Status: DC
  Administered 2016-03-23 – 2016-04-01 (×9): 15 mL via OROMUCOSAL
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
## 2016-03-23 NOTE — Progress Notes (Signed)
Rehab Admissions Coordinator Note:  Patient was screened by Trish MageLogue, Printice Hellmer M for appropriateness for an Inpatient Acute Rehab Consult.  At this time,  oththalmology surgery is pending for Wednesday.  Also, patient will need ortho surgery at some point.  Need to determine timeline for surgeries and then can consider inpatient rehab consult.  Call me for questions.     Trish MageLogue, Aubreyana Saltz M 03/23/2016, 2:58 PM  I can be reached at [phone].
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
## 2016-03-24 ENCOUNTER — Inpatient Hospital Stay (HOSPITAL_COMMUNITY): Payer: No Typology Code available for payment source

## 2016-03-24 LAB — EXPECTORATED SPUTUM ASSESSMENT W GRAM STAIN, RFLX TO RESP C: Special Requests: NORMAL

## 2016-03-24 LAB — CBC
HCT: 22.2 % — ABNORMAL LOW (ref 39.0–52.0)
HEMOGLOBIN: 7.3 g/dL — AB (ref 13.0–17.0)
MCH: 30.3 pg (ref 26.0–34.0)
MCHC: 32.9 g/dL (ref 30.0–36.0)
MCV: 92.1 fL (ref 78.0–100.0)
Platelets: 222 10*3/uL (ref 150–400)
RBC: 2.41 MIL/uL — ABNORMAL LOW (ref 4.22–5.81)
RDW: 14.6 % (ref 11.5–15.5)
WBC: 17.5 10*3/uL — ABNORMAL HIGH (ref 4.0–10.5)

## 2016-03-24 LAB — EXPECTORATED SPUTUM ASSESSMENT W REFEX TO RESP CULTURE

## 2016-03-24 MED ORDER — OXYCODONE HCL 5 MG PO TABS
5.0000 mg | ORAL_TABLET | ORAL | Status: DC | PRN
  Administered 2016-03-24 – 2016-03-25 (×3): 10 mg via ORAL
  Filled 2016-03-24 (×2): qty 2
  Filled 2016-03-24: qty 3

## 2016-03-24 MED ORDER — HYDROMORPHONE HCL 2 MG/ML IJ SOLN
0.5000 mg | INTRAMUSCULAR | Status: DC | PRN
Start: 2016-03-24 — End: 2016-03-25
  Administered 2016-03-24 (×2): 0.5 mg via INTRAVENOUS
  Filled 2016-03-24 (×2): qty 1

## 2016-03-24 MED ORDER — ONDANSETRON HCL 4 MG PO TABS
4.0000 mg | ORAL_TABLET | Freq: Three times a day (TID) | ORAL | Status: DC | PRN
  Administered 2016-03-24: 4 mg via ORAL
  Filled 2016-03-24: qty 1

## 2016-03-24 MED ORDER — ONDANSETRON 4 MG PO TBDP
4.0000 mg | ORAL_TABLET | Freq: Three times a day (TID) | ORAL | Status: DC | PRN
  Filled 2016-03-24: qty 1

## 2016-03-24 MED ORDER — DIPHENHYDRAMINE HCL 25 MG PO CAPS
25.0000 mg | ORAL_CAPSULE | Freq: Four times a day (QID) | ORAL | Status: DC | PRN
  Administered 2016-03-30 – 2016-04-01 (×5): 25 mg via ORAL
  Filled 2016-03-24 (×5): qty 1

## 2016-03-24 MED ORDER — METHOCARBAMOL 750 MG PO TABS
750.0000 mg | ORAL_TABLET | Freq: Three times a day (TID) | ORAL | Status: DC
Start: 2016-03-24 — End: 2016-03-25
  Administered 2016-03-24 – 2016-03-25 (×3): 750 mg via ORAL
  Filled 2016-03-24 (×3): qty 1

## 2016-03-24 NOTE — Progress Notes (Signed)
Patient ID: Gregory Jordan, male   DOB: January 06, 1966, 50 y.o.   MRN: [id number] Will need ORIF of fibula and pilon with possible ext fix removal in next few days.I will post likely Friday AM .                   [phone]

## 2016-03-24 NOTE — Progress Notes (Signed)
Pt pin care done. Pin sites cleaned with hydrogen peroxide per MD order and rewrapped with kerlix. Pt tolerated well. Pt noted to have mod amount of bldy drainage. No s/sx infection noted. Keeping RLE elevated. Will continue to monitor.

## 2016-03-24 NOTE — Progress Notes (Signed)
Valeta HarmsMike Jeffrey PA aware that pt's hgb today is 7.3 and WBC 17.5. No new orders at this time. Will continue to monitor.

## 2016-03-24 NOTE — Progress Notes (Addendum)
Per MD order. PCA d/ced. Upon d/cing PCA, cleared 0.6 mg/ml of dilaudid. Upon d/ced of dilaudid PCA, pt oxygen sat 97% 3 lpm. C02 40 resp 18.  Per MD order, Fentanyl patch removed. Shelbie AmmonsKorie Hudson RN witnessed the waste on the leftover Dilaudid syringe and Fentanyl patch. Fentanyl patch discarded in sharp's box. Pt abdominal dressing removed and new gauze dressing applied. Abdominal incision well approximated with staples and only a minimal amount of bldy drainage. No reddness of the incision noted. Foley d/ced per MD order. IV fluid rate decreased to 20 cc/hr due to pt is receiving IV antibiodics. Pt started on clear liquid diet per MD order. Pt instructed regarding his new pain medication regimen. Pt instructed that he has oxycodone every 4 hours as needed and dilaudid 0.5 mg IV every 4 hours as needed. Pt is taking scheduled PO Robaxin per order. Pt given the sputum collection device and instructed how to obtain. Pt verb understanding of all teachings and agrees to comply.  Pt tolerated dressing changes and foley d/ced well. Pt aware that he will be NPO after midnight for pending surgery tomorrow.
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
## 2016-03-25 ENCOUNTER — Inpatient Hospital Stay (HOSPITAL_COMMUNITY): Payer: No Typology Code available for payment source | Admitting: Anesthesiology

## 2016-03-25 ENCOUNTER — Encounter (HOSPITAL_COMMUNITY): Admission: EM | Disposition: A | Discharge status: Rehabilitation Facility | Payer: Self-pay | Source: Home / Self Care

## 2016-03-25 ENCOUNTER — Encounter (HOSPITAL_COMMUNITY): Payer: Self-pay | Admitting: Anesthesiology

## 2016-03-25 HISTORY — PX: ORIF ORBITAL FRACTURE: SHX5312

## 2016-03-25 LAB — TYPE AND SCREEN
ABO/RH(D): A POS
Antibody Screen: NEGATIVE
UNIT DIVISION: 0
UNIT DIVISION: 0
UNIT DIVISION: 0
Unit division: 0
Unit division: 0
Unit division: 0
Unit division: 0
Unit division: 0

## 2016-03-25 LAB — CBC
HEMATOCRIT: 23.1 % — AB (ref 39.0–52.0)
Hemoglobin: 7.5 g/dL — ABNORMAL LOW (ref 13.0–17.0)
MCH: 29.9 pg (ref 26.0–34.0)
MCHC: 32.5 g/dL (ref 30.0–36.0)
MCV: 92 fL (ref 78.0–100.0)
PLATELETS: 277 10*3/uL (ref 150–400)
RBC: 2.51 MIL/uL — ABNORMAL LOW (ref 4.22–5.81)
RDW: 14.7 % (ref 11.5–15.5)
WBC: 13 10*3/uL — AB (ref 4.0–10.5)

## 2016-03-25 LAB — BLOOD PRODUCT ORDER (VERBAL) VERIFICATION

## 2016-03-25 SURGERY — OPEN REDUCTION INTERNAL FIXATION (ORIF) ORBITAL FRACTURE
Anesthesia: General | Site: Face | Laterality: Right

## 2016-03-25 MED ORDER — DEXTROSE 5 % IV SOLN
3.0000 g | INTRAVENOUS | Status: AC
  Administered 2016-03-27: 3 g via INTRAVENOUS
  Filled 2016-03-25: qty 3000

## 2016-03-25 MED ORDER — FENTANYL CITRATE (PF) 100 MCG/2ML IJ SOLN
INTRAMUSCULAR | Status: AC
  Filled 2016-03-25: qty 2

## 2016-03-25 MED ORDER — SODIUM CHLORIDE 0.9 % IV SOLN
INTRAVENOUS | Status: DC
  Filled 2016-03-25 (×4): qty 1000

## 2016-03-25 MED ORDER — FENTANYL CITRATE (PF) 100 MCG/2ML IJ SOLN
INTRAMUSCULAR | Status: AC
  Filled 2016-03-25: qty 4

## 2016-03-25 MED ORDER — BSS IO SOLN
INTRAOCULAR | Status: DC | PRN
Start: 2016-03-25 — End: 2016-03-25
  Administered 2016-03-25: 1 via INTRAOCULAR

## 2016-03-25 MED ORDER — LIDOCAINE 2% (20 MG/ML) 5 ML SYRINGE
INTRAMUSCULAR | Status: AC
  Filled 2016-03-25: qty 5

## 2016-03-25 MED ORDER — ONDANSETRON HCL 4 MG/2ML IJ SOLN
INTRAMUSCULAR | Status: AC
  Filled 2016-03-25: qty 2

## 2016-03-25 MED ORDER — 0.9 % SODIUM CHLORIDE (POUR BTL) OPTIME
TOPICAL | Status: DC | PRN
  Administered 2016-03-25: 1000 mL

## 2016-03-25 MED ORDER — LIDOCAINE-EPINEPHRINE 1 %-1:100000 IJ SOLN
INTRAMUSCULAR | Status: AC
  Filled 2016-03-25: qty 1

## 2016-03-25 MED ORDER — ONDANSETRON HCL 4 MG/2ML IJ SOLN
INTRAMUSCULAR | Status: DC | PRN
  Administered 2016-03-25: 4 mg via INTRAVENOUS

## 2016-03-25 MED ORDER — LACTATED RINGERS IV SOLN
INTRAVENOUS | Status: DC
Start: 2016-03-25 — End: 2016-03-26
  Administered 2016-03-25 – 2016-03-26 (×3): via INTRAVENOUS

## 2016-03-25 MED ORDER — CEFAZOLIN SODIUM-DEXTROSE 2-4 GM/100ML-% IV SOLN
2.0000 g | INTRAVENOUS | Status: DC
  Filled 2016-03-25: qty 100

## 2016-03-25 MED ORDER — LIDOCAINE HCL (CARDIAC) 20 MG/ML IV SOLN
INTRAVENOUS | Status: DC | PRN
  Administered 2016-03-25: 60 mg via INTRAVENOUS

## 2016-03-25 MED ORDER — PROMETHAZINE HCL 25 MG/ML IJ SOLN: 6.2500 mg | INTRAMUSCULAR | Status: DC | PRN

## 2016-03-25 MED ORDER — CEFAZOLIN IN D5W 1 GM/50ML IV SOLN
INTRAVENOUS | Status: AC
  Filled 2016-03-25: qty 50

## 2016-03-25 MED ORDER — HYDROMORPHONE HCL 1 MG/ML IJ SOLN
0.2500 mg | INTRAMUSCULAR | Status: DC | PRN
  Administered 2016-03-25: 0.5 mg via INTRAVENOUS

## 2016-03-25 MED ORDER — FENTANYL CITRATE (PF) 100 MCG/2ML IJ SOLN
INTRAMUSCULAR | Status: DC | PRN
  Administered 2016-03-25 (×2): 100 ug via INTRAVENOUS
  Administered 2016-03-25 (×2): 50 ug via INTRAVENOUS

## 2016-03-25 MED ORDER — BSS IO SOLN
INTRAOCULAR | Status: AC
  Filled 2016-03-25: qty 15

## 2016-03-25 MED ORDER — CHLORHEXIDINE GLUCONATE 4 % EX LIQD
60.0000 mL | Freq: Once | CUTANEOUS | Status: DC
  Filled 2016-03-25: qty 15

## 2016-03-25 MED ORDER — PROPOFOL 10 MG/ML IV BOLUS
INTRAVENOUS | Status: DC | PRN
  Administered 2016-03-25: 150 mg via INTRAVENOUS

## 2016-03-25 MED ORDER — PROPOFOL 10 MG/ML IV BOLUS
INTRAVENOUS | Status: AC
  Filled 2016-03-25: qty 20

## 2016-03-25 MED ORDER — LIDOCAINE-EPINEPHRINE 1 %-1:100000 IJ SOLN
INTRAMUSCULAR | Status: DC | PRN
  Administered 2016-03-25: 10 mL

## 2016-03-25 MED ORDER — MIDAZOLAM HCL 2 MG/2ML IJ SOLN
INTRAMUSCULAR | Status: AC
  Filled 2016-03-25: qty 2

## 2016-03-25 MED ORDER — METHOCARBAMOL 500 MG PO TABS
1000.0000 mg | ORAL_TABLET | Freq: Three times a day (TID) | ORAL | Status: DC
  Administered 2016-03-25 – 2016-04-01 (×19): 1000 mg via ORAL
  Filled 2016-03-25 (×21): qty 2

## 2016-03-25 MED ORDER — TOBRAMYCIN-DEXAMETHASONE 0.3-0.1 % OP OINT
TOPICAL_OINTMENT | OPHTHALMIC | Status: AC
  Filled 2016-03-25: qty 3.5

## 2016-03-25 MED ORDER — HYDROMORPHONE HCL 2 MG/ML IJ SOLN
1.0000 mg | INTRAMUSCULAR | Status: DC | PRN
  Administered 2016-03-25 – 2016-03-26 (×4): 1 mg via INTRAVENOUS
  Filled 2016-03-25 (×4): qty 1

## 2016-03-25 MED ORDER — SUCCINYLCHOLINE CHLORIDE 20 MG/ML IJ SOLN
INTRAMUSCULAR | Status: DC | PRN
  Administered 2016-03-25: 120 mg via INTRAVENOUS

## 2016-03-25 MED ORDER — MIDAZOLAM HCL 5 MG/5ML IJ SOLN
INTRAMUSCULAR | Status: DC | PRN
  Administered 2016-03-25: 1 mg via INTRAVENOUS

## 2016-03-25 MED ORDER — HYDROMORPHONE HCL 1 MG/ML IJ SOLN
INTRAMUSCULAR | Status: AC
  Filled 2016-03-25: qty 0.5

## 2016-03-25 SURGICAL SUPPLY — 40 items
BLADE 10 SAFETY STRL DISP (BLADE) ×3 IMPLANT
BLADE SURG 15 STRL LF DISP TIS (BLADE) ×1 IMPLANT
BLADE SURG 15 STRL SS (BLADE) ×2
BLADE SURG ROTATE 9660 (MISCELLANEOUS) IMPLANT
CANISTER SUCTION 2500CC (MISCELLANEOUS) ×3 IMPLANT
CLEANER TIP ELECTROSURG 2X2 (MISCELLANEOUS) ×3 IMPLANT
CLOSURE WOUND 1/2 X4 (GAUZE/BANDAGES/DRESSINGS) ×2
COVER SURGICAL LIGHT HANDLE (MISCELLANEOUS) ×3 IMPLANT
DECANTER SPIKE VIAL GLASS SM (MISCELLANEOUS) ×3 IMPLANT
ELECT COATED BLADE 2.86 ST (ELECTRODE) ×3 IMPLANT
ELECT NEEDLE TIP 2.8 STRL (NEEDLE) ×3 IMPLANT
ELECT REM PT RETURN 9FT ADLT (ELECTROSURGICAL) ×3
ELECTRODE REM PT RTRN 9FT ADLT (ELECTROSURGICAL) ×1 IMPLANT
GLOVE BIO SURGEON STRL SZ 6.5 (GLOVE) ×2 IMPLANT
GLOVE BIO SURGEONS STRL SZ 6.5 (GLOVE) ×1
GOWN STRL REUS W/ TWL LRG LVL3 (GOWN DISPOSABLE) ×2 IMPLANT
GOWN STRL REUS W/TWL LRG LVL3 (GOWN DISPOSABLE) ×4
KIT BASIN OR (CUSTOM PROCEDURE TRAY) ×3 IMPLANT
KIT ROOM TURNOVER OR (KITS) ×3 IMPLANT
NEEDLE HYPO 30X.5 LL (NEEDLE) ×3 IMPLANT
NEEDLE PRECISIONGLIDE 27X1.5 (NEEDLE) ×3 IMPLANT
NS IRRIG 1000ML POUR BTL (IV SOLUTION) ×3 IMPLANT
PAD ARMBOARD 7.5X6 YLW CONV (MISCELLANEOUS) ×6 IMPLANT
PENCIL BUTTON HOLSTER BLD 10FT (ELECTRODE) ×3 IMPLANT
PLATE UPPER FACE 0.3MM SML (Plate) ×3 IMPLANT
SCISSORS WIRE ANG 4 3/4 DISP (INSTRUMENTS) ×3 IMPLANT
SCREW UPPERFACE 1.2X4M SLFDRIL (Screw) ×6 IMPLANT
STRIP CLOSURE SKIN 1/2X4 (GAUZE/BANDAGES/DRESSINGS) ×4 IMPLANT
SUT ETHILON 4 0 CL P 3 (SUTURE) IMPLANT
SUT PROLENE 6 0 PC 1 (SUTURE) IMPLANT
SUT STEEL 0 (SUTURE)
SUT STEEL 0 18XMFL TIE 17 (SUTURE) IMPLANT
SUT STEEL 2 (SUTURE) IMPLANT
SUT VIC AB 5-0 RB1 27 (SUTURE) ×6 IMPLANT
SUT VICRYL 4-0 PS2 18IN ABS (SUTURE) IMPLANT
TOWEL OR 17X24 6PK STRL BLUE (TOWEL DISPOSABLE) ×3 IMPLANT
TOWEL OR 17X26 10 PK STRL BLUE (TOWEL DISPOSABLE) ×3 IMPLANT
TRAY ENT MC OR (CUSTOM PROCEDURE TRAY) ×3 IMPLANT
TRAY FOLEY CATH 14FRSI W/METER (CATHETERS) IMPLANT
WATER STERILE IRR 1000ML POUR (IV SOLUTION) ×3 IMPLANT

## 2016-03-25 NOTE — Brief Op Note (Signed)
03/21/2016 - 03/25/2016  3:58 PM  PATIENT:  Baxter KailMichael D Tennant  50 y.o. male  PRE-OPERATIVE DIAGNOSIS:  ORBITAL FLOOR FRACTURE  POST-OPERATIVE DIAGNOSIS:  orbital flooor fracture  PROCEDURE:  Procedure(s): OPEN REDUCTION INTERNAL FIXATION (ORIF) ORBITAL FRACTURE/RIGHT (Right)  SURGEON:  Surgeon(s) and Role:    Alena Bills* Camiya Vinal S Laneice Meneely, DO - Primary  PHYSICIAN ASSISTANT: Shawn Rayburn, PA  ASSISTANTS: none   ANESTHESIA:   general  EBL:  No intake/output data recorded.  BLOOD ADMINISTERED:none  DRAINS: none   LOCAL MEDICATIONS USED:  LIDOCAINE   SPECIMEN:  No Specimen  DISPOSITION OF SPECIMEN:  N/A  COUNTS:  YES  TOURNIQUET:  * No tourniquets in log *  DICTATION: .Dragon Dictation  PLAN OF CARE: Inpatient  PATIENT DISPOSITION:  PACU - hemodynamically stable.   Delay start of Pharmacological VTE agent (>24hrs) due to surgical blood loss or risk of bleeding: no

## 2016-03-25 NOTE — Transfer of Care (Signed)
Immediate Anesthesia Transfer of Care Note  Patient: Gregory Jordan  Procedure(s) Performed: Procedure(s): OPEN REDUCTION INTERNAL FIXATION (ORIF) ORBITAL FRACTURE/RIGHT (Right)  Patient Location: PACU  Anesthesia Type:General  Level of Consciousness: sedated  Airway & Oxygen Therapy: Patient Spontanous Breathing and Patient connected to nasal cannula oxygen  Post-op Assessment: Report given to RN and Post -op Vital signs reviewed and stable  Post vital signs: Reviewed and stable  Last Vitals:  Vitals:   03/25/16 1300 03/25/16 1709  BP: (!) 157/87   Pulse: 96   Resp: 16   Temp: 37 C 36.7 C    Last Pain:  Vitals:   03/25/16 1300  TempSrc: Oral  PainSc:       Patients Stated Pain Goal: 4 (03/25/16 0650)  Complications: No apparent anesthesia complications
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
## 2016-03-25 NOTE — Progress Notes (Signed)
Met with pt this am to discuss discharge planning; mom Chris at bedside.  Pt states he has a roommate who cannot provide 24hr care at dc, and he lives in a second floor apartment.  Mom states she is due to have a knee replacement on November 27, so she is limited in the care she can provide.  Both state there is no other assistance available.  May need to consider SNF at dc should pt not have reliable support at dc.  Will continue to follow.    Reinaldo Raddle, RN, BSN  Trauma/Neuro ICU Case Manager [phone]
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
## 2016-03-25 NOTE — Anesthesia Preprocedure Evaluation (Addendum)
Anesthesia Evaluation  Patient identified by MRN, date of birth, ID band Patient awake    Reviewed: Allergy & Precautions, NPO status , Patient's Chart, lab work & pertinent test results  Airway Mallampati: [name]  TM Distance: >3 FB Neck ROM: Full    Dental no notable dental hx. (+) Dental Advisory Given   Pulmonary neg pulmonary ROS,    Pulmonary exam normal        Cardiovascular negative cardio ROS Normal cardiovascular exam     Neuro/Psych negative neurological ROS  negative psych ROS   GI/Hepatic negative GI ROS, Neg liver ROS,   Endo/Other  Morbid obesity  Renal/GU negative Renal ROS     Musculoskeletal   Abdominal   Peds  Hematology   Anesthesia Other Findings   Reproductive/Obstetrics                            Anesthesia Physical Anesthesia Plan  ASA: [name]  Anesthesia Plan: General   Post-op Pain Management:    Induction: Intravenous  Airway Management Planned: Oral ETT  Additional Equipment:   Intra-op Plan:   Post-operative Plan: Extubation in OR  Informed Consent: I have reviewed the patients History and Physical, chart, labs and discussed the procedure including the risks, benefits and alternatives for the proposed anesthesia with the patient or authorized representative who has indicated his/her understanding and acceptance.   Dental advisory given  Plan Discussed with: CRNA and Anesthesiologist  Anesthesia Plan Comments:         Anesthesia Quick Evaluation

## 2016-03-25 NOTE — Progress Notes (Signed)
Subjective: Patient c/o "stomach spasms" this morning.  RN aware.    Objective: Vital signs in last 24 hours: Temp:  [98.5 F (36.9 C)-98.9 F (37.2 C)] 98.9 F (37.2 C) (11/15 0801) Pulse Rate:  [97-116] 97 (11/15 0801) Resp:  [11-18] 16 (11/15 0801) BP: (124-146)/(69-80) 124/69 (11/15 0801) SpO2:  [92 %-97 %] 92 % (11/15 0801)  Intake/Output from previous day: 11/14 0701 - 11/15 0700 In: 820 [P.O.:720; IV Piggyback:100] Out: 1275 [Urine:1275] Intake/Output this shift: No intake/output data recorded.   Recent Labs  03/22/16 1910 03/23/16 0137 03/23/16 0712 03/23/16 1335 03/24/16 0852  HGB 8.8* 8.2* 8.3* 8.3* 7.3*    Recent Labs  03/23/16 0137  03/23/16 1335 03/24/16 0852  WBC 16.0*  --   --  17.5*  RBC 2.78*  --   --  2.41*  HCT 25.0*  < > 25.1* 22.2*  PLT 214  --   --  222  < > = values in this interval not displayed.  Recent Labs  03/23/16 0137  NA 138  K 4.1  CL 106  CO2 27  BUN 8  CREATININE 0.80  GLUCOSE 154*  CALCIUM 8.0*   No results for input(s): LABPT, INR in the last 72 hours.  Exam:  Ex fix right LE intact.  Moves toes well.  Pedal pulses intact.    Assessment/Plan: Advised patient's RN to contact trauma team regarding patient's c/o abdominal spasm.  Abnormal CT findings as documented in chart.  Possible OR Friday with Dr Ophelia CharterYates for ORIF if medically stable.    Zonia KiefJames Rovena Hearld 03/25/2016, 8:25 AM
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
## 2016-03-25 NOTE — Anesthesia Postprocedure Evaluation (Signed)
Anesthesia Post Note  Patient: Gregory Jordan  Procedure(s) Performed: Procedure(s) (LRB): OPEN REDUCTION INTERNAL FIXATION (ORIF) ORBITAL FRACTURE/RIGHT (Right)  Patient location during evaluation: PACU Anesthesia Type: MAC Level of consciousness: awake and alert Pain management: pain level controlled Vital Signs Assessment: post-procedure vital signs reviewed and stable Respiratory status: spontaneous breathing and respiratory function stable Cardiovascular status: stable Anesthetic complications: no    Last Vitals:  Vitals:   03/25/16 1753 03/25/16 1755  BP:    Pulse: 96 94  Resp: 13 (!) 21  Temp:      Last Pain:  Vitals:   03/25/16 1755  TempSrc:   PainSc: Asleep                 Rashed Edler DANIEL
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
## 2016-03-25 NOTE — Anesthesia Procedure Notes (Signed)
Procedure Name: Intubation Date/Time: 03/25/2016 4:03 PM Performed by: Ferol LuzMCMILLEN, Dakwan L Pre-anesthesia Checklist: Patient identified, Emergency Drugs available, Suction available and Patient being monitored Patient Re-evaluated:Patient Re-evaluated prior to inductionOxygen Delivery Method: Circle System Utilized Preoxygenation: Pre-oxygenation with 100% oxygen Intubation Type: IV induction and Cricoid Pressure applied Ventilation: Mask ventilation without difficulty Laryngoscope Size: Mac and 4 Grade View: Grade II Tube type: Oral Tube size: 8.0 mm Number of attempts: 1 Airway Equipment and Method: Stylet and Oral airway Placement Confirmation: ETT inserted through vocal cords under direct vision,  positive ETCO2 and breath sounds checked- equal and bilateral Secured at: 22 cm Tube secured with: Tape Dental Injury: Teeth and Oropharynx as per pre-operative assessment

## 2016-03-25 NOTE — Progress Notes (Signed)
PT Cancellation Note  Patient Details Name: Gregory Jordan MRN: [name] DOB: [birth year]   Cancelled Treatment:    Reason Eval/Treat Not Completed: Patient at procedure or test/unavailable.  Pt is in the OR.  PT will check back tomorrow.  Thanks,    Rollene Rotundaebecca B. Idelia Caudell, PT, DPT [phone]   03/25/2016, 3:07 PM

## 2016-03-26 ENCOUNTER — Other Ambulatory Visit (INDEPENDENT_AMBULATORY_CARE_PROVIDER_SITE_OTHER): Payer: Self-pay | Admitting: Orthopaedic Surgery

## 2016-03-26 ENCOUNTER — Encounter (HOSPITAL_COMMUNITY): Payer: Self-pay | Admitting: *Deleted

## 2016-03-26 DIAGNOSIS — S0285XA Fracture of orbit, unspecified, initial encounter for closed fracture: Secondary | ICD-10-CM | POA: Diagnosis present

## 2016-03-26 DIAGNOSIS — R571 Hypovolemic shock: Secondary | ICD-10-CM | POA: Diagnosis present

## 2016-03-26 DIAGNOSIS — S35229A Unspecified injury of superior mesenteric artery, initial encounter: Secondary | ICD-10-CM | POA: Diagnosis present

## 2016-03-26 DIAGNOSIS — D62 Acute posthemorrhagic anemia: Secondary | ICD-10-CM | POA: Diagnosis present

## 2016-03-26 LAB — CBC
HEMATOCRIT: 23.6 % — AB (ref 39.0–52.0)
HEMOGLOBIN: 7.6 g/dL — AB (ref 13.0–17.0)
MCH: 29.9 pg (ref 26.0–34.0)
MCHC: 32.2 g/dL (ref 30.0–36.0)
MCV: 92.9 fL (ref 78.0–100.0)
Platelets: 290 10*3/uL (ref 150–400)
RBC: 2.54 MIL/uL — AB (ref 4.22–5.81)
RDW: 15 % (ref 11.5–15.5)
WBC: 11.6 10*3/uL — ABNORMAL HIGH (ref 4.0–10.5)

## 2016-03-26 LAB — CULTURE, RESPIRATORY W GRAM STAIN: Culture: NORMAL

## 2016-03-26 LAB — CULTURE, RESPIRATORY

## 2016-03-26 MED ORDER — POLYETHYLENE GLYCOL 3350 17 G PO PACK
17.0000 g | PACK | Freq: Every day | ORAL | Status: DC
  Administered 2016-03-26 – 2016-04-01 (×6): 17 g via ORAL
  Filled 2016-03-26 (×6): qty 1

## 2016-03-26 MED ORDER — PANTOPRAZOLE SODIUM 40 MG PO TBEC
40.0000 mg | DELAYED_RELEASE_TABLET | Freq: Every day | ORAL | Status: DC
  Administered 2016-03-26 – 2016-04-01 (×6): 40 mg via ORAL
  Filled 2016-03-26 (×6): qty 1

## 2016-03-26 MED ORDER — DOCUSATE SODIUM 100 MG PO CAPS
200.0000 mg | ORAL_CAPSULE | Freq: Two times a day (BID) | ORAL | Status: DC
  Administered 2016-03-26 – 2016-03-28 (×4): 200 mg via ORAL
  Administered 2016-03-29: 100 mg via ORAL
  Administered 2016-03-29 – 2016-04-01 (×6): 200 mg via ORAL
  Filled 2016-03-26 (×11): qty 2

## 2016-03-26 MED ORDER — OXYCODONE HCL 5 MG PO TABS
5.0000 mg | ORAL_TABLET | ORAL | Status: DC | PRN
  Administered 2016-03-26 (×2): 10 mg via ORAL
  Administered 2016-03-26 – 2016-04-01 (×28): 15 mg via ORAL
  Filled 2016-03-26 (×9): qty 3
  Filled 2016-03-26: qty 2
  Filled 2016-03-26 (×4): qty 3
  Filled 2016-03-26: qty 2
  Filled 2016-03-26 (×7): qty 3
  Filled 2016-03-26: qty 2
  Filled 2016-03-26 (×8): qty 3

## 2016-03-26 MED ORDER — TOBRAMYCIN-DEXAMETHASONE 0.3-0.1 % OP OINT
TOPICAL_OINTMENT | Freq: Two times a day (BID) | OPHTHALMIC | Status: DC
  Administered 2016-03-26: 1 via OPHTHALMIC
  Administered 2016-03-26 – 2016-03-30 (×8): via OPHTHALMIC
  Administered 2016-03-30: 1 via OPHTHALMIC
  Administered 2016-03-31: 09:00:00 via OPHTHALMIC
  Administered 2016-03-31: 1 via OPHTHALMIC
  Administered 2016-04-01: 10:00:00 via OPHTHALMIC
  Filled 2016-03-26: qty 3.5

## 2016-03-26 MED ORDER — HYDROMORPHONE HCL 2 MG/ML IJ SOLN: 0.5000 mg | INTRAMUSCULAR | Status: DC | PRN

## 2016-03-26 NOTE — Progress Notes (Signed)
   Subjective: 1 Day Post-Op Procedure(s) (LRB): OPEN REDUCTION INTERNAL FIXATION (ORIF) ORBITAL FRACTURE/RIGHT (Right) Patient reports pain as moderate.    Objective: Vital signs in last 24 hours: Temp:  [97.8 F (36.6 C)-99.1 F (37.3 C)] 99.1 F (37.3 C) (11/16 0607) Pulse Rate:  [90-108] 96 (11/16 0607) Resp:  [13-22] 18 (11/16 0607) BP: (127-157)/(66-87) 137/69 (11/16 0607) SpO2:  [92 %-97 %] 94 % (11/16 0607)  Intake/Output from previous day: 11/15 0701 - 11/16 0700 In: 877 [I.V.:777; IV Piggyback:100] Out: 10 [Blood:10] Intake/Output this shift: No intake/output data recorded.   Recent Labs  03/23/16 1335 03/24/16 0852 03/25/16 0843 03/26/16 0348  HGB 8.3* 7.3* 7.5* 7.6*    Recent Labs  03/25/16 0843 03/26/16 0348  WBC 13.0* 11.6*  RBC 2.51* 2.54*  HCT 23.1* 23.6*  PLT 277 290   No results for input(s): NA, K, CL, CO2, BUN, CREATININE, GLUCOSE, CALCIUM in the last 72 hours. No results for input(s): LABPT, INR in the last 72 hours.  Neurologically intact  Assessment/Plan: 1 Day Post-Op Procedure(s) (LRB): OPEN REDUCTION INTERNAL FIXATION (ORIF) ORBITAL FRACTURE/RIGHT (Right) Plan : surgery tomorrow AM on ankle.   Eldred MangesMark C Lorana Maffeo 03/26/2016, 8:04 AM

## 2016-03-26 NOTE — Progress Notes (Signed)
Dr Lindie SpruceWyatt gave verbal orders to allow Pt to go back to a clear liquid diet. Pt tollerating well. No n/v/d. Will notify day shift nurse.
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
## 2016-03-26 NOTE — Progress Notes (Signed)
OT Cancellation Note  Patient Details Name: Gregory Jordan MRN: [id number] DOB: 1966-03-06   Cancelled Treatment:    Reason Eval/Treat Not Completed: Fatigue/lethargy limiting ability to participate;Other (comment). Pt's RN requested therapies return later this afternoon as pt just up with nursing staff to bathroom x 30 minutes and back to bed and pt very fatigued. Will check back later as able  Galen ManilaSpencer, Trannie Bardales Jeanette 03/26/2016, 12:31 PM

## 2016-03-27 ENCOUNTER — Encounter (HOSPITAL_COMMUNITY): Admission: EM | Disposition: A | Discharge status: Rehabilitation Facility | Payer: Self-pay | Source: Home / Self Care

## 2016-03-27 ENCOUNTER — Inpatient Hospital Stay (HOSPITAL_COMMUNITY): Payer: No Typology Code available for payment source | Admitting: Anesthesiology

## 2016-03-27 ENCOUNTER — Encounter (HOSPITAL_COMMUNITY): Payer: Self-pay | Admitting: Surgery

## 2016-03-27 ENCOUNTER — Inpatient Hospital Stay (HOSPITAL_COMMUNITY): Payer: No Typology Code available for payment source

## 2016-03-27 HISTORY — PX: ORIF FIBULA FRACTURE: SHX5114

## 2016-03-27 LAB — CBC
HCT: 23.8 % — ABNORMAL LOW (ref 39.0–52.0)
Hemoglobin: 7.6 g/dL — ABNORMAL LOW (ref 13.0–17.0)
MCH: 29.8 pg (ref 26.0–34.0)
MCHC: 31.9 g/dL (ref 30.0–36.0)
MCV: 93.3 fL (ref 78.0–100.0)
PLATELETS: 281 10*3/uL (ref 150–400)
RBC: 2.55 MIL/uL — AB (ref 4.22–5.81)
RDW: 15.7 % — ABNORMAL HIGH (ref 11.5–15.5)
WBC: 9.1 10*3/uL (ref 4.0–10.5)

## 2016-03-27 LAB — PREPARE RBC (CROSSMATCH)

## 2016-03-27 SURGERY — OPEN REDUCTION INTERNAL FIXATION (ORIF) FIBULA FRACTURE
Anesthesia: General | Site: Ankle | Laterality: Right

## 2016-03-27 MED ORDER — FENTANYL CITRATE (PF) 100 MCG/2ML IJ SOLN
INTRAMUSCULAR | Status: AC
  Administered 2016-03-27: 25 ug via INTRAVENOUS
  Filled 2016-03-27: qty 2

## 2016-03-27 MED ORDER — OXYCODONE HCL 5 MG PO TABS: 5.0000 mg | ORAL_TABLET | Freq: Once | ORAL | Status: DC | PRN

## 2016-03-27 MED ORDER — PHENYLEPHRINE HCL 10 MG/ML IJ SOLN
INTRAMUSCULAR | Status: DC | PRN
  Administered 2016-03-27: 80 ug via INTRAVENOUS

## 2016-03-27 MED ORDER — SUGAMMADEX SODIUM 200 MG/2ML IV SOLN
INTRAVENOUS | Status: DC | PRN
  Administered 2016-03-27: 300 mg via INTRAVENOUS

## 2016-03-27 MED ORDER — SUCCINYLCHOLINE CHLORIDE 20 MG/ML IJ SOLN
INTRAMUSCULAR | Status: DC | PRN
  Administered 2016-03-27: 80 mg via INTRAVENOUS

## 2016-03-27 MED ORDER — FENTANYL CITRATE (PF) 100 MCG/2ML IJ SOLN
INTRAMUSCULAR | Status: AC
  Filled 2016-03-27: qty 2

## 2016-03-27 MED ORDER — METOCLOPRAMIDE HCL 5 MG/ML IJ SOLN: 5.0000 mg | Freq: Three times a day (TID) | INTRAMUSCULAR | Status: DC | PRN

## 2016-03-27 MED ORDER — ONDANSETRON HCL 4 MG/2ML IJ SOLN: 4.0000 mg | Freq: Four times a day (QID) | INTRAMUSCULAR | Status: DC | PRN

## 2016-03-27 MED ORDER — LIDOCAINE HCL (CARDIAC) 20 MG/ML IV SOLN
INTRAVENOUS | Status: DC | PRN
  Administered 2016-03-27: 80 mg via INTRAVENOUS

## 2016-03-27 MED ORDER — ONDANSETRON HCL 4 MG PO TABS: 4.0000 mg | ORAL_TABLET | Freq: Four times a day (QID) | ORAL | Status: DC | PRN

## 2016-03-27 MED ORDER — PROPOFOL 10 MG/ML IV BOLUS
INTRAVENOUS | Status: DC | PRN
  Administered 2016-03-27: 20 mg via INTRAVENOUS
  Administered 2016-03-27: 100 mg via INTRAVENOUS
  Administered 2016-03-27 (×3): 20 mg via INTRAVENOUS

## 2016-03-27 MED ORDER — SODIUM CHLORIDE 0.9 % IV SOLN: Freq: Once | INTRAVENOUS | Status: DC

## 2016-03-27 MED ORDER — ALBUMIN HUMAN 5 % IV SOLN
INTRAVENOUS | Status: DC | PRN
  Administered 2016-03-27: 13:00:00 via INTRAVENOUS

## 2016-03-27 MED ORDER — OXYCODONE HCL 5 MG/5ML PO SOLN: 5.0000 mg | Freq: Once | ORAL | Status: DC | PRN

## 2016-03-27 MED ORDER — SODIUM CHLORIDE 0.9 % IV SOLN
INTRAVENOUS | Status: DC | PRN
  Administered 2016-03-27: 12:00:00 via INTRAVENOUS

## 2016-03-27 MED ORDER — ACETAMINOPHEN 650 MG RE SUPP: 650.0000 mg | Freq: Four times a day (QID) | RECTAL | Status: DC | PRN

## 2016-03-27 MED ORDER — 0.9 % SODIUM CHLORIDE (POUR BTL) OPTIME
TOPICAL | Status: DC | PRN
  Administered 2016-03-27: 1000 mL

## 2016-03-27 MED ORDER — LACTATED RINGERS IV SOLN
INTRAVENOUS | Status: DC
  Administered 2016-03-27 (×2): via INTRAVENOUS

## 2016-03-27 MED ORDER — CEFAZOLIN IN D5W 1 GM/50ML IV SOLN
1.0000 g | Freq: Three times a day (TID) | INTRAVENOUS | Status: AC
  Administered 2016-03-27 – 2016-03-28 (×3): 1 g via INTRAVENOUS
  Filled 2016-03-27 (×3): qty 50

## 2016-03-27 MED ORDER — METOCLOPRAMIDE HCL 5 MG PO TABS: 5.0000 mg | ORAL_TABLET | Freq: Three times a day (TID) | ORAL | Status: DC | PRN

## 2016-03-27 MED ORDER — FENTANYL CITRATE (PF) 100 MCG/2ML IJ SOLN
INTRAMUSCULAR | Status: DC | PRN
  Administered 2016-03-27: 50 ug via INTRAVENOUS
  Administered 2016-03-27 (×2): 100 ug via INTRAVENOUS
  Administered 2016-03-27 (×3): 50 ug via INTRAVENOUS

## 2016-03-27 MED ORDER — MIDAZOLAM HCL 2 MG/2ML IJ SOLN
INTRAMUSCULAR | Status: AC
  Filled 2016-03-27: qty 2

## 2016-03-27 MED ORDER — FENTANYL CITRATE (PF) 100 MCG/2ML IJ SOLN
25.0000 ug | INTRAMUSCULAR | Status: DC | PRN
  Administered 2016-03-27 (×4): 25 ug via INTRAVENOUS

## 2016-03-27 MED ORDER — ONDANSETRON HCL 4 MG/2ML IJ SOLN
INTRAMUSCULAR | Status: DC | PRN
  Administered 2016-03-27: 4 mg via INTRAVENOUS

## 2016-03-27 MED ORDER — HYDROMORPHONE HCL 2 MG/ML IJ SOLN
1.0000 mg | INTRAMUSCULAR | Status: DC | PRN
  Administered 2016-03-27 – 2016-04-01 (×8): 1 mg via INTRAVENOUS
  Filled 2016-03-27 (×8): qty 1

## 2016-03-27 MED ORDER — ACETAMINOPHEN 325 MG PO TABS
650.0000 mg | ORAL_TABLET | Freq: Four times a day (QID) | ORAL | Status: DC | PRN
  Administered 2016-03-27 – 2016-03-30 (×4): 650 mg via ORAL
  Filled 2016-03-27 (×4): qty 2

## 2016-03-27 MED ORDER — ROCURONIUM BROMIDE 100 MG/10ML IV SOLN
INTRAVENOUS | Status: DC | PRN
  Administered 2016-03-27: 30 mg via INTRAVENOUS
  Administered 2016-03-27: 20 mg via INTRAVENOUS
  Administered 2016-03-27 (×2): 10 mg via INTRAVENOUS

## 2016-03-27 MED ORDER — PROPOFOL 10 MG/ML IV BOLUS
INTRAVENOUS | Status: AC
  Filled 2016-03-27: qty 40

## 2016-03-27 SURGICAL SUPPLY — 82 items
BANDAGE ACE 4X5 VEL STRL LF (GAUZE/BANDAGES/DRESSINGS) IMPLANT
BANDAGE ACE 6X5 VEL STRL LF (GAUZE/BANDAGES/DRESSINGS) IMPLANT
BANDAGE ELASTIC 4 VELCRO ST LF (GAUZE/BANDAGES/DRESSINGS) ×3 IMPLANT
BANDAGE ELASTIC 6 VELCRO ST LF (GAUZE/BANDAGES/DRESSINGS) ×3 IMPLANT
BANDAGE ESMARK 6X9 LF (GAUZE/BANDAGES/DRESSINGS) IMPLANT
BIT DRILL 2.5X2.75 QC CALB (BIT) ×3 IMPLANT
BIT DRILL 3.8 CANN DISP (BIT) ×3 IMPLANT
BIT DRILL CALIBRATED 2.7 (BIT) ×2 IMPLANT
BIT DRILL CALIBRATED 2.7MM (BIT) ×1
BNDG ESMARK 6X9 LF (GAUZE/BANDAGES/DRESSINGS)
COVER MAYO STAND STRL (DRAPES) ×3 IMPLANT
COVER SURGICAL LIGHT HANDLE (MISCELLANEOUS) ×3 IMPLANT
CUFF TOURNIQUET SINGLE 34IN LL (TOURNIQUET CUFF) ×3 IMPLANT
CUFF TOURNIQUET SINGLE 44IN (TOURNIQUET CUFF) IMPLANT
DRAPE C-ARM 42X72 X-RAY (DRAPES) IMPLANT
DRAPE INCISE IOBAN 66X45 STRL (DRAPES) ×3 IMPLANT
DRAPE PROXIMA HALF (DRAPES) ×3 IMPLANT
DRAPE U-SHAPE 47X51 STRL (DRAPES) ×3 IMPLANT
DRSG PAD ABDOMINAL 8X10 ST (GAUZE/BANDAGES/DRESSINGS) ×3 IMPLANT
DURAPREP 26ML APPLICATOR (WOUND CARE) ×3 IMPLANT
ELECT REM PT RETURN 9FT ADLT (ELECTROSURGICAL) ×3
ELECTRODE REM PT RTRN 9FT ADLT (ELECTROSURGICAL) ×1 IMPLANT
GAUZE SPONGE 4X4 12PLY STRL (GAUZE/BANDAGES/DRESSINGS) ×3 IMPLANT
GAUZE XEROFORM 5X9 LF (GAUZE/BANDAGES/DRESSINGS) ×3 IMPLANT
GLOVE BIOGEL PI IND STRL 8 (GLOVE) ×2 IMPLANT
GLOVE BIOGEL PI INDICATOR 8 (GLOVE) ×4
GLOVE ORTHO TXT STRL SZ7.5 (GLOVE) ×6 IMPLANT
GOWN STRL REUS W/ TWL LRG LVL3 (GOWN DISPOSABLE) ×1 IMPLANT
GOWN STRL REUS W/ TWL XL LVL3 (GOWN DISPOSABLE) ×1 IMPLANT
GOWN STRL REUS W/TWL 2XL LVL3 (GOWN DISPOSABLE) ×3 IMPLANT
GOWN STRL REUS W/TWL LRG LVL3 (GOWN DISPOSABLE) ×2
GOWN STRL REUS W/TWL XL LVL3 (GOWN DISPOSABLE) ×2
K-WIRE ACE 1.6X6 (WIRE) ×12
KIT BASIN OR (CUSTOM PROCEDURE TRAY) ×3 IMPLANT
KIT ROOM TURNOVER OR (KITS) ×3 IMPLANT
KWIRE ACE 1.6X6 (WIRE) ×4 IMPLANT
MANIFOLD NEPTUNE II (INSTRUMENTS) ×3 IMPLANT
NS IRRIG 1000ML POUR BTL (IV SOLUTION) ×3 IMPLANT
PACK ORTHO EXTREMITY (CUSTOM PROCEDURE TRAY) ×3 IMPLANT
PAD ARMBOARD 7.5X6 YLW CONV (MISCELLANEOUS) ×6 IMPLANT
PAD CAST 4YDX4 CTTN HI CHSV (CAST SUPPLIES) ×1 IMPLANT
PADDING CAST COTTON 4X4 STRL (CAST SUPPLIES) ×2
PADDING CAST COTTON 6X4 STRL (CAST SUPPLIES) ×3 IMPLANT
PLATE 9H 184 RT MED DIST TIB (Plate) ×3 IMPLANT
PLATE LOCK 6H 77 BILAT FIB (Plate) ×6 IMPLANT
SCREW CANN 5.0X50MM (Screw) ×2 IMPLANT
SCREW CANN SM 50X5XSLF DRL CAN (Screw) ×1 IMPLANT
SCREW CORT T15 30X3.5XST LCK (Screw) ×1 IMPLANT
SCREW CORTICAL 3.5MM  28MM (Screw) ×4 IMPLANT
SCREW CORTICAL 3.5MM  34MM (Screw) ×2 IMPLANT
SCREW CORTICAL 3.5MM 22MM (Screw) ×6 IMPLANT
SCREW CORTICAL 3.5MM 26MM (Screw) ×3 IMPLANT
SCREW CORTICAL 3.5MM 28MM (Screw) ×2 IMPLANT
SCREW CORTICAL 3.5MM 34MM (Screw) ×1 IMPLANT
SCREW CORTICAL 3.5X30MM (Screw) ×2 IMPLANT
SCREW LOCK 3.5X36 DIST TIB (Screw) ×3 IMPLANT
SCREW LOCK CORT STAR 3.5X10 (Screw) ×9 IMPLANT
SCREW LOCK CORT STAR 3.5X12 (Screw) ×9 IMPLANT
SCREW LOCK CORT STAR 3.5X16 (Screw) ×3 IMPLANT
SCREW LOCK CORT STAR 3.5X20 (Screw) ×3 IMPLANT
SCREW LOCK CORT STAR 3.5X30 (Screw) ×3 IMPLANT
SCREW LOW PROFILE 22MMX3.5MM (Screw) ×3 IMPLANT
SCREW LP 3.5X44 (Screw) ×6 IMPLANT
SCREW NON LOCKING LP 3.5 14MM (Screw) ×6 IMPLANT
SCREW NON LOCKING LP 3.5 16MM (Screw) ×6 IMPLANT
SPLINT FIBERGLASS 4X30 (CAST SUPPLIES) ×3 IMPLANT
SPONGE LAP 18X18 X RAY DECT (DISPOSABLE) ×3 IMPLANT
STAPLER VISISTAT 35W (STAPLE) ×3 IMPLANT
SUCTION FRAZIER HANDLE 10FR (MISCELLANEOUS) ×2
SUCTION TUBE FRAZIER 10FR DISP (MISCELLANEOUS) ×1 IMPLANT
SUT ETHILON 2 0 FS 18 (SUTURE) ×6 IMPLANT
SUT ETHILON 3 0 PS 1 (SUTURE) ×6 IMPLANT
SUT VIC AB 0 CT1 27 (SUTURE) ×6
SUT VIC AB 0 CT1 27XBRD ANBCTR (SUTURE) ×3 IMPLANT
SUT VIC AB 2-0 CT1 27 (SUTURE) ×8
SUT VIC AB 2-0 CT1 TAPERPNT 27 (SUTURE) ×4 IMPLANT
TOWEL OR 17X24 6PK STRL BLUE (TOWEL DISPOSABLE) ×3 IMPLANT
TOWEL OR 17X26 10 PK STRL BLUE (TOWEL DISPOSABLE) ×3 IMPLANT
TUBE CONNECTING 12'X1/4 (SUCTIONS) ×1
TUBE CONNECTING 12X1/4 (SUCTIONS) ×2 IMPLANT
WATER STERILE IRR 1000ML POUR (IV SOLUTION) IMPLANT
YANKAUER SUCT BULB TIP NO VENT (SUCTIONS) ×3 IMPLANT
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
## 2016-03-27 NOTE — Anesthesia Procedure Notes (Signed)
Procedure Name: Intubation Date/Time: 03/27/2016 10:59 AM Performed by: Fransisca KaufmannMEYER, Gray Doering E Pre-anesthesia Checklist: Patient identified, Emergency Drugs available, Suction available and Patient being monitored Patient Re-evaluated:Patient Re-evaluated prior to inductionOxygen Delivery Method: Circle System Utilized Preoxygenation: Pre-oxygenation with 100% oxygen Intubation Type: IV induction Ventilation: Mask ventilation without difficulty Grade View: Grade II Tube type: Oral Tube size: 8.0 mm Number of attempts: 1 Airway Equipment and Method: Stylet and Oral airway Placement Confirmation: ETT inserted through vocal cords under direct vision,  positive ETCO2 and breath sounds checked- equal and bilateral Secured at: 23 cm Tube secured with: Tape Dental Injury: Teeth and Oropharynx as per pre-operative assessment

## 2016-03-27 NOTE — Transfer of Care (Signed)
Immediate Anesthesia Transfer of Care Note  Patient: Gregory Jordan  Procedure(s) Performed: Procedure(s): OPEN REDUCTION INTERNAL FIXATION (ORIF) RIGHT FIBULA AND PILON, WITH EXTERNAL FIXATION REMOVAL RIGHT LEG (Right)  Patient Location: PACU  Anesthesia Type:General  Level of Consciousness: awake, alert , oriented and sedated  Airway & Oxygen Therapy: Patient Spontanous Breathing and Patient connected to face mask oxygen  Post-op Assessment: Report given to RN, Post -op Vital signs reviewed and stable and Patient moving all extremities  Post vital signs: Reviewed and stable  Last Vitals:  Vitals:   03/27/16 1340 03/27/16 1341  BP:  120/69  Pulse:    Resp:  19  Temp: 36.9 C     Last Pain:  Vitals:   03/27/16 1340  TempSrc:   PainSc: 0-No pain      Patients Stated Pain Goal: 5 (03/26/16 1957)  Complications: No apparent anesthesia complications

## 2016-03-27 NOTE — Anesthesia Postprocedure Evaluation (Signed)
Anesthesia Post Note  Patient: Baxter KailMichael D Copado  Procedure(s) Performed: Procedure(s) (LRB): OPEN REDUCTION INTERNAL FIXATION (ORIF) RIGHT FIBULA AND PILON, WITH EXTERNAL FIXATION REMOVAL RIGHT LEG (Right)  Patient location during evaluation: PACU Anesthesia Type: General Level of consciousness: awake Pain management: pain level controlled Vital Signs Assessment: post-procedure vital signs reviewed and stable Respiratory status: spontaneous breathing Cardiovascular status: stable Postop Assessment: no signs of nausea or vomiting Anesthetic complications: no     Last Vitals:  Vitals:   03/27/16 1440 03/27/16 1549  BP: 136/77 (!) 144/79  Pulse: (!) 107 (!) 104  Resp: 13 16  Temp:  37.3 C    Last Pain:  Vitals:   03/27/16 1714  TempSrc:   PainSc: 10-Worst pain ever   Pain Goal: Patients Stated Pain Goal: 5 (03/26/16 1957)               Ancel Easler

## 2016-03-27 NOTE — Op Note (Signed)
Preop diagnosis: Right distal tibia intra-articular displaced P1 fracture with fibular fracture. Second procedure after initial washout and external fixator application.  Postop diagnosis: Same  Procedure: Open reduction internal fixation of right distal tibia intra-articular Pilon fracture with plate fixation. Open reduction internal fixation right fibula fracture. Removal of external fixator spanning the ankle joint.  Surgeon: Annell GreeningMark Yates M.D.  Anesthesia Gen.  Tourniquet: Less than 90 minutes please see anesthetic record  Procedure: After induction general anesthesia proximal thigh tourniquet application antibiotic prophylaxis and timeout procedure external fixture was meticulously scrubbed with scrub brushes and Betadine scrub followed by Betadine scrub solution and Betadine prep. Betadine spray was used in addition to meticulously clean external fixator. Standard extremity drapes and sterile sheets were applied.  External fixator was loosened proximally an additional distraction was applied checking under C-arm to make sure that the joint was significantly distracted. Sterile skin marker was used an incision was made over the fibula after leg elevation tourniquet inflation after Esmarch wrapping. Fibula was anatomically reduced and fixed with a Biomet locking plate with 3 screws above and 3 screws below the fracture.   Incision was made over the distal lateral tibial articular surface. Extensors were taken medially fracture was reduced and a 5 lag screw was placed after the joint was distracted with a small laminar spreader and intra-articular piece of bone was removed from the joint. With the joint completely cleaned out joint was reduced anatomically and lag screw closing and anatomically. Incision was made medially with C-arm verification over the proximal portion of the spike fracture was reduced into lag screws were placed across the fracture closing the fracture down anatomically. Incision  was extended distally and medially and a medial Biomet distal tibia anatomic plate was selected adjusted under C-arm and then multiple screws were placed combination of initial nonlocking screws to set the plate down directly down to the bony surface and then locking screws. Intermittently C-arm was checked AP and lateral to confirm appropriate position of screws. One screw after second the plate down was initially slightly long and it was exchanged at the end of the case. Joint was reduced. Mortise is well reduced. Distal articular surface looked good on AP lateral and mortise x-ray. C-arm was used for rotation. Syndesmosis was stable since the ligaments were attached to the distal fibula and the distal lateral portion of the tibia which had been aligned and closed back anatomically. Copious irrigation and then closure all incisions with repair of the extensor retinaculum over the distal lateral tibial surface. Fibular incision was closed with 2-0 Vicryl skin staple closure. Far near near far 2-0 nylon sutures were used for closure of other areas. The distal lateral area where the initial type II open fracture was present was still left open with 1 cm round area and sterile dressing was Xeroform and a short leg splint was then applied. All skin bridges had good viability and tourniquet was deflated prior to closure. Patient a good palpable pulses and was transferred recovery room in stable condition.
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
## 2016-03-27 NOTE — Anesthesia Preprocedure Evaluation (Signed)
Anesthesia Evaluation  Patient identified by MRN, date of birth, ID band Patient awake    Reviewed: Allergy & Precautions, NPO status , Patient's Chart, lab work & pertinent test results  Airway Mallampati: [name]  TM Distance: >3 FB Neck ROM: Full    Dental no notable dental hx. (+) Dental Advisory Given   Pulmonary neg pulmonary ROS,    Pulmonary exam normal        Cardiovascular negative cardio ROS Normal cardiovascular exam Rhythm:Regular     Neuro/Psych negative neurological ROS  negative psych ROS   GI/Hepatic negative GI ROS, Neg liver ROS,   Endo/Other  Morbid obesity  Renal/GU negative Renal ROS     Musculoskeletal   Abdominal   Peds  Hematology  (+) anemia ,   Anesthesia Other Findings   Reproductive/Obstetrics                             Anesthesia Physical Anesthesia Plan  ASA: [name]  Anesthesia Plan: General   Post-op Pain Management:    Induction: Intravenous  Airway Management Planned: Oral ETT  Additional Equipment: None  Intra-op Plan: Utilization Of Total Body Hypothermia per surgeon request  Post-operative Plan: Extubation in OR  Informed Consent: I have reviewed the patients History and Physical, chart, labs and discussed the procedure including the risks, benefits and alternatives for the proposed anesthesia with the patient or authorized representative who has indicated his/her understanding and acceptance.   Dental advisory given  Plan Discussed with: CRNA and Surgeon  Anesthesia Plan Comments:         Anesthesia Quick Evaluation

## 2016-03-27 NOTE — Interval H&P Note (Signed)
History and Physical Interval Note:  03/27/2016 9:39 AM  Gregory Jordan  has presented today for surgery, with the diagnosis of Right Tibia-Fibula Fracture, External Fixation Right Leg  The various methods of treatment have been discussed with the patient and family. After consideration of risks, benefits and other options for treatment, the patient has consented to  Procedure(s): OPEN REDUCTION INTERNAL FIXATION (ORIF) RIGHT FIBULA AND PILON, WITH POSSIBLE EXTERNAL FIXATION REMOVAL RIGHT LEG (Right) as a surgical intervention .  The patient's history has been reviewed, patient examined, no change in status, stable for surgery.  I have reviewed the patient's chart and labs.  Questions were answered to the patient's satisfaction.     Eldred MangesMark C Yates

## 2016-03-28 LAB — CBC
HEMATOCRIT: 25.9 % — AB (ref 39.0–52.0)
HEMOGLOBIN: 8.1 g/dL — AB (ref 13.0–17.0)
MCH: 29 pg (ref 26.0–34.0)
MCHC: 31.3 g/dL (ref 30.0–36.0)
MCV: 92.8 fL (ref 78.0–100.0)
Platelets: 277 10*3/uL (ref 150–400)
RBC: 2.79 MIL/uL — AB (ref 4.22–5.81)
RDW: 16.6 % — AB (ref 11.5–15.5)
WBC: 10 10*3/uL (ref 4.0–10.5)

## 2016-03-28 LAB — BASIC METABOLIC PANEL
ANION GAP: 8 (ref 5–15)
CHLORIDE: 97 mmol/L — AB (ref 101–111)
CO2: 32 mmol/L (ref 22–32)
Calcium: 7.9 mg/dL — ABNORMAL LOW (ref 8.9–10.3)
Creatinine, Ser: 0.69 mg/dL (ref 0.61–1.24)
Glucose, Bld: 123 mg/dL — ABNORMAL HIGH (ref 65–99)
POTASSIUM: 3.2 mmol/L — AB (ref 3.5–5.1)
SODIUM: 137 mmol/L (ref 135–145)

## 2016-03-28 NOTE — Progress Notes (Signed)
Right foot perfused and sensate compts ok Pain ok Keep elevated nwb
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
## 2016-03-28 NOTE — Significant Event (Signed)
Rapid Response Event Note  Overview: Called by Rn for patient with decreased sats and pain Time Called: 1644 Arrival Time: 1650 Event Type: Other (Comment)  Initial Focused Assessment:  Called by RN for patient with decreased sats in the 70's on nasal cannula 3 LPM. and pain.  On my arrival to patients room, Rn at bedside.  Patient sitting in chair with left foot elevated.  BP 144/64, RR 13, HR 105 sats reading 70% on nasal cannula 5 LPM, Not good wave form.  Probed changed to new finger sats 100% on 5 LPM.  Dropped O2 to 3 LPM sats 94%.  Patient c/o feeling dizzy, burning pain at PIV site and left foot.  Denies CP or SOB   Interventions: RN administering pain medication via PIV.  Left foot/toes swollen, patient states more swollen than earlier, RN states it is about the same.  Patient can wiggle toes, good capillary refill.    Plan of Care (if not transferred): Rn to monitor   Event Summary:  RN to call if assistance needed   at       at          [facility] CenterWolfe, Maryagnes Amosenise Ann

## 2016-03-29 LAB — BASIC METABOLIC PANEL
ANION GAP: 7 (ref 5–15)
BUN: 5 mg/dL — ABNORMAL LOW (ref 6–20)
CALCIUM: 7.9 mg/dL — AB (ref 8.9–10.3)
CO2: 33 mmol/L — ABNORMAL HIGH (ref 22–32)
CREATININE: 0.7 mg/dL (ref 0.61–1.24)
Chloride: 96 mmol/L — ABNORMAL LOW (ref 101–111)
GFR calc non Af Amer: >60 mL/min (ref >60)
Glucose, Bld: 111 mg/dL — ABNORMAL HIGH (ref 65–99)
Potassium: 3 mmol/L — ABNORMAL LOW (ref 3.5–5.1)
SODIUM: 136 mmol/L (ref 135–145)

## 2016-03-29 MED ORDER — POTASSIUM CHLORIDE CRYS ER 20 MEQ PO TBCR
40.0000 meq | EXTENDED_RELEASE_TABLET | Freq: Two times a day (BID) | ORAL | Status: AC
  Administered 2016-03-29 (×2): 40 meq via ORAL
  Filled 2016-03-29 (×2): qty 2

## 2016-03-29 MED ORDER — PIPERACILLIN-TAZOBACTAM 3.375 G IVPB
3.3750 g | Freq: Three times a day (TID) | INTRAVENOUS | Status: DC
  Administered 2016-03-29 – 2016-04-01 (×10): 3.375 g via INTRAVENOUS
  Filled 2016-03-29 (×12): qty 50

## 2016-03-29 MED ORDER — POTASSIUM CHLORIDE 2 MEQ/ML IV SOLN
INTRAVENOUS | Status: DC
  Administered 2016-03-29: 11:00:00 via INTRAVENOUS
  Filled 2016-03-29 (×3): qty 1000

## 2016-03-29 NOTE — Progress Notes (Addendum)
When dressing changed on abdomen, erythema noted around umbilicus and skin surrounding umbilicus. Small amount of purulent drainage was present on old dressing and in umbilicus. Area cleaned with saline and clean dressing applied.
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
## 2016-03-29 NOTE — Progress Notes (Signed)
Patient stable  right foot dorsiflexion plantar flexion intact  mobilize as possible with liver injury  Dr. Ophelia CharterYates will see Casimiro NeedleMichael this week

## 2016-03-30 ENCOUNTER — Encounter (HOSPITAL_COMMUNITY): Payer: Self-pay | Admitting: Orthopaedic Surgery

## 2016-03-30 ENCOUNTER — Inpatient Hospital Stay (HOSPITAL_COMMUNITY): Payer: No Typology Code available for payment source

## 2016-03-30 DIAGNOSIS — S0280XD Fracture of other specified skull and facial bones, unspecified side, subsequent encounter for fracture with routine healing: Secondary | ICD-10-CM

## 2016-03-30 DIAGNOSIS — Z419 Encounter for procedure for purposes other than remedying health state, unspecified: Secondary | ICD-10-CM

## 2016-03-30 DIAGNOSIS — S35229D Unspecified injury of superior mesenteric artery, subsequent encounter: Secondary | ICD-10-CM

## 2016-03-30 DIAGNOSIS — S36113D Laceration of liver, unspecified degree, subsequent encounter: Secondary | ICD-10-CM

## 2016-03-30 DIAGNOSIS — S0231XA Fracture of orbital floor, right side, initial encounter for closed fracture: Secondary | ICD-10-CM

## 2016-03-30 DIAGNOSIS — G8918 Other acute postprocedural pain: Secondary | ICD-10-CM

## 2016-03-30 DIAGNOSIS — D62 Acute posthemorrhagic anemia: Secondary | ICD-10-CM

## 2016-03-30 LAB — BASIC METABOLIC PANEL
ANION GAP: 8 (ref 5–15)
BUN: 5 mg/dL — ABNORMAL LOW (ref 6–20)
CO2: 31 mmol/L (ref 22–32)
Calcium: 7.9 mg/dL — ABNORMAL LOW (ref 8.9–10.3)
Chloride: 98 mmol/L — ABNORMAL LOW (ref 101–111)
Creatinine, Ser: 0.77 mg/dL (ref 0.61–1.24)
GFR calc Af Amer: >60 mL/min (ref >60)
GLUCOSE: 125 mg/dL — AB (ref 65–99)
POTASSIUM: 3.4 mmol/L — AB (ref 3.5–5.1)
Sodium: 137 mmol/L (ref 135–145)

## 2016-03-30 LAB — CBC
HEMATOCRIT: 25.2 % — AB (ref 39.0–52.0)
HEMOGLOBIN: 7.8 g/dL — AB (ref 13.0–17.0)
MCH: 28.8 pg (ref 26.0–34.0)
MCHC: 31 g/dL (ref 30.0–36.0)
MCV: 93 fL (ref 78.0–100.0)
Platelets: 266 10*3/uL (ref 150–400)
RBC: 2.71 MIL/uL — AB (ref 4.22–5.81)
RDW: 16.3 % — ABNORMAL HIGH (ref 11.5–15.5)
WBC: 8.1 10*3/uL (ref 4.0–10.5)

## 2016-03-30 MED ORDER — POTASSIUM CHLORIDE CRYS ER 20 MEQ PO TBCR
20.0000 meq | EXTENDED_RELEASE_TABLET | Freq: Once | ORAL | Status: AC
  Administered 2016-03-30: 20 meq via ORAL
  Filled 2016-03-30: qty 1

## 2016-03-30 NOTE — Progress Notes (Signed)
Orthopedic Tech Progress Note Patient Details:  Gregory Jordan [birth year] [id number]  Ortho Devices Type of Ortho Device: CAM walker Ortho Device/Splint Location: RLE Ortho Device/Splint Interventions: Ordered, Application   Jennye MoccasinHughes, [name] [name] 03/30/2016, 5:15 PM
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
## 2016-03-30 NOTE — Progress Notes (Signed)
   Subjective: 3 Days Post-Op Procedure(s) (LRB): OPEN REDUCTION INTERNAL FIXATION (ORIF) RIGHT FIBULA AND PILON, WITH EXTERNAL FIXATION REMOVAL RIGHT LEG (Right) Patient reports pain as moderate.    Objective: Vital signs in last 24 hours: Temp:  [98.2 F (36.8 C)-99.8 F (37.7 C)] 98.2 F (36.8 C) (11/20 1300) Pulse Rate:  [85-96] 94 (11/20 1300) Resp:  [18] 18 (11/20 1300) BP: (116-143)/(66-78) 143/78 (11/20 1300) SpO2:  [94 %-99 %] 96 % (11/20 1300)  Intake/Output from previous day: 11/19 0701 - 11/20 0700 In: 2188.8 [P.O.:800; I.V.:1388.8] Out: 400 [Urine:400] Intake/Output this shift: Total I/O In: 530 [P.O.:480; IV Piggyback:50] Out: -    Recent Labs  03/28/16 0425 03/30/16 0245  HGB 8.1* 7.8*    Recent Labs  03/28/16 0425 03/30/16 0245  WBC 10.0 8.1  RBC 2.79* 2.71*  HCT 25.9* 25.2*  PLT 277 266    Recent Labs  03/29/16 0528 03/30/16 0245  NA 136 137  K 3.0* 3.4*  CL 96* 98*  CO2 33* 31  BUN 5* 5*  CREATININE 0.70 0.77  GLUCOSE 111* 125*  CALCIUM 7.9* 7.9*   No results for input(s): LABPT, INR in the last 72 hours.  Neurologically intact to right LE.  Splint removed dressing changed. Pin tracts betadine applied.   Assessment/Plan: 3 Days Post-Op Procedure(s) (LRB): OPEN REDUCTION INTERNAL FIXATION (ORIF) RIGHT FIBULA AND PILON, WITH EXTERNAL FIXATION REMOVAL RIGHT LEG (Right) Plan:   Plain radiographs of right tib/fib and ankle to document post op position.  Then application of CAM boot.   Gregory Jordan 03/30/2016, 2:58 PM
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
## 2016-03-31 LAB — BASIC METABOLIC PANEL
Anion gap: 7 (ref 5–15)
BUN: 6 mg/dL (ref 6–20)
CALCIUM: 8.5 mg/dL — AB (ref 8.9–10.3)
CO2: 30 mmol/L (ref 22–32)
CREATININE: 0.78 mg/dL (ref 0.61–1.24)
Chloride: 100 mmol/L — ABNORMAL LOW (ref 101–111)
GFR calc non Af Amer: >60 mL/min (ref >60)
Glucose, Bld: 99 mg/dL (ref 65–99)
Potassium: 4.1 mmol/L (ref 3.5–5.1)
SODIUM: 137 mmol/L (ref 135–145)

## 2016-03-31 LAB — CBC
HCT: 27 % — ABNORMAL LOW (ref 39.0–52.0)
Hemoglobin: 8.1 g/dL — ABNORMAL LOW (ref 13.0–17.0)
MCH: 28 pg (ref 26.0–34.0)
MCHC: 30 g/dL (ref 30.0–36.0)
MCV: 93.4 fL (ref 78.0–100.0)
PLATELETS: 283 10*3/uL (ref 150–400)
RBC: 2.89 MIL/uL — ABNORMAL LOW (ref 4.22–5.81)
RDW: 16.3 % — AB (ref 11.5–15.5)
WBC: 7.3 10*3/uL (ref 4.0–10.5)

## 2016-03-31 LAB — TYPE AND SCREEN
ABO/RH(D): A POS
ANTIBODY SCREEN: NEGATIVE
Unit division: 0
Unit division: 0

## 2016-03-31 NOTE — Progress Notes (Signed)
Patient ID: Gregory Jordan, male   DOB: 04-Apr-1966, 50 y.o.   MRN: [id number] xrays reviewed. Minimal drainage on dressing.  CAM boot on when he gets up
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
## 2016-03-31 NOTE — Progress Notes (Signed)
Inpatient Rehabilitation  I met with the patient at the bedside to discuss the recommendation for IP Rehab.  I provided informational booklets and answered all of pt.'s questions.  Pt. has a second floor apartment with 13 steps to access. He has spoken with his mother and she is in agreement to let pt. stay with her while he recovers.  At pt's request, I will initiate the insurance authorization process for a potential admission to CIR.  Please call if questions.  Susan Blankenship PT Inpatient Rehab Admissions Coordinator Cell [phone] Office [phone]  
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
## 2016-03-31 NOTE — Progress Notes (Signed)
OT Cancellation Note  Patient Details Name: Gregory Jordan Obenchain MRN: [id number] DOB: 1965-12-09   Cancelled Treatment:    Reason Eval/Treat Not Completed: Patient declined. Medical issues which prohibited therapy. Other (comment): Pt complaining of intense pain at this time and requested OT wait until after pain meds given. RN notified that pt requesting pain meds. OT will check back as able. Pt reports that he has been ambulating to bathroom with nursing staff throughout the day.  [address] Walnutwood StreetCharity A Dariush Mcnellis, OTR/L [phone] 03/31/2016, 3:03 PM
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
## 2016-04-01 ENCOUNTER — Inpatient Hospital Stay (HOSPITAL_COMMUNITY)
Admission: RE | Admit: 2016-04-01 | Discharge: 2016-04-09 | DRG: 559 | Disposition: A | Discharge status: Home / Self Care | Payer: 59 | Source: Intra-hospital | Attending: Physical Medicine & Rehabilitation | Admitting: Physical Medicine & Rehabilitation

## 2016-04-01 ENCOUNTER — Telehealth (INDEPENDENT_AMBULATORY_CARE_PROVIDER_SITE_OTHER): Payer: Self-pay | Admitting: Radiology

## 2016-04-01 DIAGNOSIS — K661 Hemoperitoneum: Secondary | ICD-10-CM | POA: Diagnosis present

## 2016-04-01 DIAGNOSIS — S82871D Displaced pilon fracture of right tibia, subsequent encounter for closed fracture with routine healing: Secondary | ICD-10-CM

## 2016-04-01 DIAGNOSIS — L03818 Cellulitis of other sites: Secondary | ICD-10-CM | POA: Diagnosis not present

## 2016-04-01 DIAGNOSIS — S82401E Unspecified fracture of shaft of right fibula, subsequent encounter for open fracture type I or II with routine healing: Secondary | ICD-10-CM

## 2016-04-01 DIAGNOSIS — H532 Diplopia: Secondary | ICD-10-CM

## 2016-04-01 DIAGNOSIS — Z8781 Personal history of (healed) traumatic fracture: Secondary | ICD-10-CM | POA: Diagnosis not present

## 2016-04-01 DIAGNOSIS — L0390 Cellulitis, unspecified: Secondary | ICD-10-CM

## 2016-04-01 DIAGNOSIS — L03316 Cellulitis of umbilicus: Secondary | ICD-10-CM | POA: Diagnosis present

## 2016-04-01 DIAGNOSIS — G8918 Other acute postprocedural pain: Secondary | ICD-10-CM | POA: Diagnosis not present

## 2016-04-01 DIAGNOSIS — Z6841 Body Mass Index (BMI) 40.0 and over, adult: Secondary | ICD-10-CM | POA: Diagnosis not present

## 2016-04-01 DIAGNOSIS — S35229S Unspecified injury of superior mesenteric artery, sequela: Secondary | ICD-10-CM

## 2016-04-01 DIAGNOSIS — S82874S Nondisplaced pilon fracture of right tibia, sequela: Secondary | ICD-10-CM

## 2016-04-01 DIAGNOSIS — D62 Acute posthemorrhagic anemia: Secondary | ICD-10-CM | POA: Diagnosis present

## 2016-04-01 DIAGNOSIS — S82871A Displaced pilon fracture of right tibia, initial encounter for closed fracture: Secondary | ICD-10-CM

## 2016-04-01 DIAGNOSIS — S0511XD Contusion of eyeball and orbital tissues, right eye, subsequent encounter: Secondary | ICD-10-CM | POA: Diagnosis not present

## 2016-04-01 DIAGNOSIS — R609 Edema, unspecified: Secondary | ICD-10-CM | POA: Diagnosis not present

## 2016-04-01 DIAGNOSIS — Z87891 Personal history of nicotine dependence: Secondary | ICD-10-CM

## 2016-04-01 DIAGNOSIS — T8189XD Other complications of procedures, not elsewhere classified, subsequent encounter: Secondary | ICD-10-CM | POA: Diagnosis not present

## 2016-04-01 DIAGNOSIS — S0230XD Fracture of orbital floor, unspecified side, subsequent encounter for fracture with routine healing: Principal | ICD-10-CM

## 2016-04-01 DIAGNOSIS — S82871S Displaced pilon fracture of right tibia, sequela: Secondary | ICD-10-CM

## 2016-04-01 DIAGNOSIS — K567 Ileus, unspecified: Secondary | ICD-10-CM | POA: Diagnosis present

## 2016-04-01 DIAGNOSIS — T1490XA Injury, unspecified, initial encounter: Secondary | ICD-10-CM | POA: Diagnosis present

## 2016-04-01 DIAGNOSIS — R030 Elevated blood-pressure reading, without diagnosis of hypertension: Secondary | ICD-10-CM

## 2016-04-01 DIAGNOSIS — L282 Other prurigo: Secondary | ICD-10-CM

## 2016-04-01 DIAGNOSIS — L299 Pruritus, unspecified: Secondary | ICD-10-CM

## 2016-04-01 DIAGNOSIS — Z967 Presence of other bone and tendon implants: Secondary | ICD-10-CM | POA: Diagnosis not present

## 2016-04-01 DIAGNOSIS — Z9889 Other specified postprocedural states: Secondary | ICD-10-CM

## 2016-04-01 DIAGNOSIS — S0280XS Fracture of other specified skull and facial bones, unspecified side, sequela: Secondary | ICD-10-CM

## 2016-04-01 DIAGNOSIS — S0231XS Fracture of orbital floor, right side, sequela: Secondary | ICD-10-CM

## 2016-04-01 LAB — BASIC METABOLIC PANEL
ANION GAP: 7 (ref 5–15)
BUN: 5 mg/dL — AB (ref 6–20)
CALCIUM: 8.2 mg/dL — AB (ref 8.9–10.3)
CO2: 29 mmol/L (ref 22–32)
Chloride: 102 mmol/L (ref 101–111)
Creatinine, Ser: 0.91 mg/dL (ref 0.61–1.24)
GFR calc Af Amer: >60 mL/min (ref >60)
GLUCOSE: 104 mg/dL — AB (ref 65–99)
POTASSIUM: 4.3 mmol/L (ref 3.5–5.1)
SODIUM: 138 mmol/L (ref 135–145)

## 2016-04-01 LAB — CBC
HCT: 26.8 % — ABNORMAL LOW (ref 39.0–52.0)
Hemoglobin: 8.2 g/dL — ABNORMAL LOW (ref 13.0–17.0)
MCH: 28.5 pg (ref 26.0–34.0)
MCHC: 30.6 g/dL (ref 30.0–36.0)
MCV: 93.1 fL (ref 78.0–100.0)
PLATELETS: 267 10*3/uL (ref 150–400)
RBC: 2.88 MIL/uL — AB (ref 4.22–5.81)
RDW: 16.4 % — AB (ref 11.5–15.5)
WBC: 7.4 10*3/uL (ref 4.0–10.5)

## 2016-04-01 MED ORDER — ACETAMINOPHEN 325 MG PO TABS: 325.0000 mg | ORAL_TABLET | ORAL | Status: DC | PRN

## 2016-04-01 MED ORDER — GUAIFENESIN-DM 100-10 MG/5ML PO SYRP: 5.0000 mL | ORAL_SOLUTION | Freq: Four times a day (QID) | ORAL | Status: DC | PRN

## 2016-04-01 MED ORDER — TOBRAMYCIN-DEXAMETHASONE 0.3-0.1 % OP OINT
TOPICAL_OINTMENT | Freq: Two times a day (BID) | OPHTHALMIC | Status: DC
  Administered 2016-04-01 – 2016-04-03 (×4): via OPHTHALMIC
  Administered 2016-04-03: 1 via OPHTHALMIC
  Administered 2016-04-04 – 2016-04-08 (×10): via OPHTHALMIC
  Filled 2016-04-01 (×2): qty 3.5

## 2016-04-01 MED ORDER — PHENOL 1.4 % MT LIQD: 1.0000 | OROMUCOSAL | Status: DC | PRN

## 2016-04-01 MED ORDER — PIPERACILLIN-TAZOBACTAM 3.375 G IVPB
3.3750 g | Freq: Three times a day (TID) | INTRAVENOUS | Status: DC
  Administered 2016-04-01 – 2016-04-07 (×17): 3.375 g via INTRAVENOUS
  Filled 2016-04-01 (×18): qty 50

## 2016-04-01 MED ORDER — ALUM & MAG HYDROXIDE-SIMETH 200-200-20 MG/5ML PO SUSP: 30.0000 mL | ORAL | Status: DC | PRN

## 2016-04-01 MED ORDER — TRAZODONE HCL 50 MG PO TABS
25.0000 mg | ORAL_TABLET | Freq: Every evening | ORAL | Status: DC | PRN
  Administered 2016-04-05: 25 mg via ORAL
  Filled 2016-04-01: qty 1

## 2016-04-01 MED ORDER — ONDANSETRON HCL 4 MG/2ML IJ SOLN: 4.0000 mg | Freq: Four times a day (QID) | INTRAMUSCULAR | Status: DC | PRN

## 2016-04-01 MED ORDER — METHOCARBAMOL 500 MG PO TABS
1000.0000 mg | ORAL_TABLET | Freq: Three times a day (TID) | ORAL | Status: DC
  Administered 2016-04-01 – 2016-04-09 (×24): 1000 mg via ORAL
  Filled 2016-04-01 (×24): qty 2

## 2016-04-01 MED ORDER — ONDANSETRON HCL 4 MG PO TABS
4.0000 mg | ORAL_TABLET | Freq: Four times a day (QID) | ORAL | Status: DC | PRN
  Filled 2016-04-01: qty 1

## 2016-04-01 MED ORDER — BISACODYL 10 MG RE SUPP: 10.0000 mg | Freq: Every day | RECTAL | Status: DC | PRN

## 2016-04-01 MED ORDER — DIPHENHYDRAMINE HCL 25 MG PO CAPS
25.0000 mg | ORAL_CAPSULE | Freq: Four times a day (QID) | ORAL | Status: DC | PRN
  Administered 2016-04-02 – 2016-04-07 (×7): 25 mg via ORAL
  Filled 2016-04-01 (×7): qty 1

## 2016-04-01 MED ORDER — PANTOPRAZOLE SODIUM 40 MG PO TBEC
40.0000 mg | DELAYED_RELEASE_TABLET | Freq: Every day | ORAL | Status: DC
  Administered 2016-04-02 – 2016-04-09 (×8): 40 mg via ORAL
  Filled 2016-04-01 (×8): qty 1

## 2016-04-01 MED ORDER — POLYETHYLENE GLYCOL 3350 17 G PO PACK
17.0000 g | PACK | Freq: Every day | ORAL | Status: DC
  Administered 2016-04-03 – 2016-04-09 (×3): 17 g via ORAL
  Filled 2016-04-01 (×7): qty 1

## 2016-04-01 MED ORDER — OXYCODONE HCL 5 MG PO TABS
10.0000 mg | ORAL_TABLET | ORAL | Status: DC | PRN
  Administered 2016-04-01 – 2016-04-09 (×22): 15 mg via ORAL
  Filled 2016-04-01 (×18): qty 3
  Filled 2016-04-01: qty 2
  Filled 2016-04-01 (×5): qty 3

## 2016-04-01 MED ORDER — POLYSACCHARIDE IRON COMPLEX 150 MG PO CAPS
150.0000 mg | ORAL_CAPSULE | Freq: Two times a day (BID) | ORAL | Status: DC
  Administered 2016-04-01 – 2016-04-08 (×15): 150 mg via ORAL
  Filled 2016-04-01 (×14): qty 1

## 2016-04-01 MED ORDER — FLEET ENEMA 7-19 GM/118ML RE ENEM: 1.0000 | ENEMA | Freq: Once | RECTAL | Status: DC | PRN

## 2016-04-01 MED ORDER — DOCUSATE SODIUM 100 MG PO CAPS
200.0000 mg | ORAL_CAPSULE | Freq: Two times a day (BID) | ORAL | Status: DC
  Administered 2016-04-01 – 2016-04-09 (×11): 200 mg via ORAL
  Filled 2016-04-01 (×16): qty 2

## 2016-04-01 MED ORDER — TRAMADOL HCL 50 MG PO TABS
50.0000 mg | ORAL_TABLET | Freq: Four times a day (QID) | ORAL | Status: DC | PRN
  Administered 2016-04-01 – 2016-04-08 (×6): 50 mg via ORAL
  Filled 2016-04-01 (×7): qty 1
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
## 2016-04-01 NOTE — Progress Notes (Signed)
Trauma Service Note  Subjective: Patient doing well and awiting approval for Rehab  Objective: Vital signs in last 24 hours: Temp:  [98.3 F (36.8 C)-99 F (37.2 C)] 98.3 F (36.8 C) (11/22 0500) Pulse Rate:  [92-103] 96 (11/22 0500) Resp:  [16] 16 (11/22 0500) BP: (120-163)/(66-83) 141/75 (11/22 0500) SpO2:  [94 %-97 %] 95 % (11/22 0500) Last BM Date: 03/31/16  Intake/Output from previous day: 11/21 0701 - 11/22 0700 In: 530 [P.O.:480; IV Piggyback:50] Out: -  Intake/Output this shift: Total I/O In: 240 [P.O.:240] Out: -   General: No acute distress.  Says that he will b going home with his mother after Rehab  Lungs: Clear  Abd: Soft, good bowel sounds.  Having bowel movements  Extremities: No changes  Neuro: Intact  Lab Results: CBC   Recent Labs  03/31/16 0515 04/01/16 0522  WBC 7.3 7.4  HGB 8.1* 8.2*  HCT 27.0* 26.8*  PLT 283 267   BMET  Recent Labs  03/31/16 0515 04/01/16 0522  NA 137 138  K 4.1 4.3  CL 100* 102  CO2 30 29  GLUCOSE 99 104*  BUN 6 5*  CREATININE 0.78 0.91  CALCIUM 8.5* 8.2*   PT/INR No results for input(s): LABPROT, INR in the last 72 hours. ABG No results for input(s): PHART, HCO3 in the last 72 hours.  Invalid input(s): PCO2, PO2  Studies/Results: Dg Ankle 2 Views Right  Result Date: 03/30/2016 CLINICAL DATA:  Postop EXAM: RIGHT ANKLE - 2 VIEW COMPARISON:  None. FINDINGS: Plate and screw fixation across the comminuted distal tibial fracture and transverse distal fibular shaft fracture. Near anatomic alignment. No hardware or bony complicating features. IMPRESSION: Internal fixation across the distal tibia and fibular fractures. Electronically Signed   By: Charlett NoseKevin  Dover M.D.   On: 03/30/2016 15:40    Anti-infectives: Anti-infectives    Start     Dose/Rate Route Frequency Ordered Stop   03/29/16 1200  piperacillin-tazobactam (ZOSYN) IVPB 3.375 g     3.375 g 12.5 mL/hr over 240 Minutes Intravenous Every 8 hours  03/29/16 0939     03/27/16 1900  ceFAZolin (ANCEF) IVPB 1 g/50 mL premix     1 g 100 mL/hr over 30 Minutes Intravenous Every 8 hours 03/27/16 1512 03/28/16 1043   03/27/16 0600  ceFAZolin (ANCEF) 3 g in dextrose 5 % 50 mL IVPB     3 g 130 mL/hr over 30 Minutes Intravenous To ShortStay Surgical 03/25/16 2209 03/27/16 1055   03/26/16 0600  ceFAZolin (ANCEF) IVPB 2g/100 mL premix  Status:  Discontinued     2 g 200 mL/hr over 30 Minutes Intravenous On call to O.R. 03/25/16 1827 03/25/16 2209   03/22/16 2030  ceFAZolin (ANCEF) IVPB 2g/100 mL premix     2 g 200 mL/hr over 30 Minutes Intravenous To Surgery 03/22/16 2016 03/23/16 2030   03/22/16 0300  ceFAZolin (ANCEF) IVPB 1 g/50 mL premix  Status:  Discontinued     1 g 100 mL/hr over 30 Minutes Intravenous Every 6 hours 03/21/16 2344 03/27/16 1352   03/21/16 1800  ceFAZolin (ANCEF) IVPB 2g/100 mL premix     2 g 200 mL/hr over 30 Minutes Intravenous  Once 03/21/16 1757 03/21/16 1829   03/21/16 1752  ceFAZolin (ANCEF) 2-4 GM/100ML-% IVPB    Comments:  Shanna CiscoBrown, Kevin   : cabinet override      03/21/16 1752 03/22/16 0559      Assessment/Plan: s/p Procedure(s): OPEN REDUCTION INTERNAL FIXATION (ORIF) RIGHT FIBULA AND  PILON, WITH EXTERNAL FIXATION REMOVAL RIGHT LEG Awaiting appropriate disposition  LOS: 11 days   [name], [name], MD, FACS [phone] Trauma Surgeon 04/01/2016

## 2016-04-01 NOTE — Telephone Encounter (Signed)
While speaking with Gregory Jordan about another patient, she stated that she would like for someone to come by and see Gregory Jordan in the next day or so. She states there is a moderate amount of drainage at incision and she has concerns for cellulitis. Patient is already on IV Zosyn for cellulitis in his abdomen.  Patient is in 104W-19.    I sent phone message to Dr. Ophelia CharterYates to advise.
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
## 2016-04-01 NOTE — Progress Notes (Signed)
[facility]. All questions/concerns addressed. Pt will be transferring to 4W19 via wheelchair. Will continue to monitor
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
## 2016-04-01 NOTE — Progress Notes (Signed)
I have insurance approval for IP Rehab and medical clearance from Dr. Lindie SpruceWyatt to admit pt. to CIR today.  Pt. is agreeable.  I have updated Sidney AceJulie Amerson, RNCM and pt's RN Rockne CoonsKatelin of the plan.  Please call if questions.  Weldon PickingSusan Aulden Calise PT Inpatient Rehab Admissions Coordinator Cell [phone] Office [phone]

## 2016-04-01 NOTE — Care Management Note (Signed)
Case Management Note  Patient Details  Name: Gregory Jordan MRN: [id number] Date of Birth: 10/31/1965  Subjective/Objective:   Pt medically stable for discharge today.  Insurance authorization received for inpatient rehab.                   Action/Plan: Plan dc to [facility] later today.  Mother has agreed for pt to dc to her home after rehab.    Expected Discharge Date:    04/01/2016            Expected Discharge Plan:  IP Rehab Facility  In-House Referral:     Discharge planning Services  CM Consult  Post Acute Care Choice:    Choice offered to:     DME Arranged:    DME Agency:     HH Arranged:    HH Agency:     Status of Service:  Completed, signed off  If discussed at [employer] Length of [employer] Meetings, dates discussed:    Additional Comments:  Quintella BatonJulie W. Pilar Westergaard, RN, BSN  Trauma/Neuro ICU Case Manager [phone]
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
## 2016-04-01 NOTE — Progress Notes (Signed)
Pt admitted to unit at 1645 with family at bedside. Rn reviewed rehab booklet, process, and schedule to pt and family. Pt verbally understands safety precautions. Call bell within reach, family at bedside.
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
## 2016-04-02 ENCOUNTER — Inpatient Hospital Stay (HOSPITAL_COMMUNITY): Payer: 59

## 2016-04-02 ENCOUNTER — Inpatient Hospital Stay (HOSPITAL_COMMUNITY): Payer: 59 | Admitting: Occupational Therapy

## 2016-04-02 DIAGNOSIS — L03818 Cellulitis of other sites: Secondary | ICD-10-CM

## 2016-04-02 DIAGNOSIS — L299 Pruritus, unspecified: Secondary | ICD-10-CM

## 2016-04-02 DIAGNOSIS — T1490XA Injury, unspecified, initial encounter: Secondary | ICD-10-CM

## 2016-04-02 DIAGNOSIS — R609 Edema, unspecified: Secondary | ICD-10-CM

## 2016-04-02 DIAGNOSIS — G8918 Other acute postprocedural pain: Secondary | ICD-10-CM

## 2016-04-02 DIAGNOSIS — D62 Acute posthemorrhagic anemia: Secondary | ICD-10-CM

## 2016-04-02 LAB — COMPREHENSIVE METABOLIC PANEL
ALT: 40 U/L (ref 17–63)
AST: 40 U/L (ref 15–41)
Albumin: 2.8 g/dL — ABNORMAL LOW (ref 3.5–5.0)
Alkaline Phosphatase: 54 U/L (ref 38–126)
Anion gap: 7 (ref 5–15)
BUN: 7 mg/dL (ref 6–20)
CHLORIDE: 102 mmol/L (ref 101–111)
CO2: 29 mmol/L (ref 22–32)
CREATININE: 0.95 mg/dL (ref 0.61–1.24)
Calcium: 8.5 mg/dL — ABNORMAL LOW (ref 8.9–10.3)
GFR calc Af Amer: >60 mL/min (ref >60)
Glucose, Bld: 105 mg/dL — ABNORMAL HIGH (ref 65–99)
Potassium: 4.3 mmol/L (ref 3.5–5.1)
SODIUM: 138 mmol/L (ref 135–145)
Total Bilirubin: 0.8 mg/dL (ref 0.3–1.2)
Total Protein: 5.7 g/dL — ABNORMAL LOW (ref 6.5–8.1)

## 2016-04-02 LAB — CBC WITH DIFFERENTIAL/PLATELET
Basophils Absolute: 0 10*3/uL (ref 0.0–0.1)
Basophils Relative: 0 %
EOS PCT: 6 %
Eosinophils Absolute: 0.5 10*3/uL (ref 0.0–0.7)
HCT: 27.4 % — ABNORMAL LOW (ref 39.0–52.0)
Hemoglobin: 8.4 g/dL — ABNORMAL LOW (ref 13.0–17.0)
LYMPHS PCT: 29 %
Lymphs Abs: 2.3 10*3/uL (ref 0.7–4.0)
MCH: 28.5 pg (ref 26.0–34.0)
MCHC: 30.7 g/dL (ref 30.0–36.0)
MCV: 92.9 fL (ref 78.0–100.0)
MONO ABS: 0.6 10*3/uL (ref 0.1–1.0)
MONOS PCT: 8 %
NEUTROS PCT: 57 %
Neutro Abs: 4.5 10*3/uL (ref 1.7–7.7)
PLATELETS: 284 10*3/uL (ref 150–400)
RBC: 2.95 MIL/uL — AB (ref 4.22–5.81)
RDW: 16.7 % — ABNORMAL HIGH (ref 11.5–15.5)
WBC: 7.9 10*3/uL (ref 4.0–10.5)

## 2016-04-02 MED ORDER — GLUCERNA SHAKE PO LIQD
237.0000 mL | Freq: Two times a day (BID) | ORAL | Status: DC
  Administered 2016-04-02 – 2016-04-08 (×12): 237 mL via ORAL

## 2016-04-02 NOTE — Progress Notes (Signed)
Patient ID: Gregory Jordan, male   DOB: May 19, 1965, 50 y.o.   MRN: [id number] Foot swollen , blisters at edge of incision. WBC is OK. No purulent drainage. Needs foot up max height to decrease swelling. I will check him tomorrow afternoon. If swelling does not go down then I will take him back to OR on Saturday and apply a VAC.  Keep foot up max height please. I discussed with him that his albumin is low and he needs increased protein intake to heal .         My cell is [phone]. I will check him again tomorrow afternoon.
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
## 2016-04-02 NOTE — Progress Notes (Signed)
*  PRELIMINARY RESULTS* Vascular Ultrasound Bilateral lower extremity venous duplex has been completed.  Preliminary findings: No evidence of deep vein thrombosis or baker's cysts bilaterally.   Gregory FischerCharlotte C Miyanna Jordan 04/02/2016, 1:01 PM
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
## 2016-04-03 ENCOUNTER — Inpatient Hospital Stay (HOSPITAL_COMMUNITY): Payer: 59 | Admitting: Occupational Therapy

## 2016-04-03 ENCOUNTER — Inpatient Hospital Stay (HOSPITAL_COMMUNITY): Payer: 59 | Admitting: Physical Therapy

## 2016-04-03 DIAGNOSIS — Z8781 Personal history of (healed) traumatic fracture: Secondary | ICD-10-CM

## 2016-04-03 DIAGNOSIS — Z967 Presence of other bone and tendon implants: Secondary | ICD-10-CM

## 2016-04-03 DIAGNOSIS — T8189XD Other complications of procedures, not elsewhere classified, subsequent encounter: Secondary | ICD-10-CM

## 2016-04-03 DIAGNOSIS — T8189XA Other complications of procedures, not elsewhere classified, initial encounter: Secondary | ICD-10-CM | POA: Insufficient documentation

## 2016-04-03 DIAGNOSIS — Z9889 Other specified postprocedural states: Secondary | ICD-10-CM

## 2016-04-03 NOTE — Progress Notes (Signed)
[place] PHYSICAL MEDICINE & REHABILITATION     PROGRESS NOTE  Subjective/Complaints:  Pt seen sitting up in bed.  He states he slept well overnight.  He states his RLE was evaluated by surgery and they were not pleased with the way it looked with plans for ?debridement.   ROS: +Pruritis. Denies CP, SOB, N/V/D.  Objective: Vital Signs: Blood pressure 132/72, pulse 98, temperature 98.4 F (36.9 C), temperature source Oral, resp. rate 16, SpO2 98 %. No results found.  Recent Labs  04/01/16 0522 04/02/16 0526  WBC 7.4 7.9  HGB 8.2* 8.4*  HCT 26.8* 27.4*  PLT 267 284    Recent Labs  04/01/16 0522 04/02/16 0526  NA 138 138  K 4.3 4.3  CL 102 102  GLUCOSE 104* 105*  BUN 5* 7  CREATININE 0.91 0.95  CALCIUM 8.2* 8.5*   CBG (last 3)  No results for input(s): GLUCAP in the last 72 hours.  Wt Readings from Last 3 Encounters:  03/22/16 130.2 kg (287 lb 0.6 oz)    Physical Exam:  BP 132/72 (BP Location: Right Arm)   Pulse 98   Temp 98.4 F (36.9 C) (Oral)   Resp 16   SpO2 98%  Constitutional: He appears, well-developed. NAD. Obese  HENT: Normocephalic and atraumatic.  Eyes: Ecchymosis with edema right eye lid.   Cardiovascular: Normal rate and regular rhythm.  No JVD. Respiratory: Effort normal and breath sounds normal. No stridor. No respiratory distress. He has no wheezes.  GI: Soft. He exhibits distension. There is tenderness.  Musculoskeletal: He exhibits edema and tenderness.   Neurological: He is alert and oriented.  Follows basic commands without difficulty.  Motor: RUE: 5/5 proximal to distal RLE: HF 3/5, distally limited by incisions/pain, able to wiggle toes LUE/LLE: 5/5 proximal to distal Skin:  Moderate amount of drainage right lateral ankle, open area.  Erythema along and in between both incisions and small blisters along distal aspect of incision.  Midline abd incision with staples intact, small open area with serous drainage.   Periumbilical  cellulitis, stable since yesterday Appears edematous.  Psychiatric: He has a normal mood and affect. His behavior is normal. Judgment and thought content normal.   Assessment/Plan: 1. Functional deficits secondary to polytrauma which require 3+ hours per day of interdisciplinary therapy in a comprehensive inpatient rehab setting. Physiatrist is providing close team supervision and 24 hour management of active medical problems listed below. Physiatrist and rehab team continue to assess barriers to discharge/monitor patient progress toward functional and medical goals.  Function:  Bathing Bathing position      Bathing parts      Bathing assist        Upper Body Dressing/Undressing Upper body dressing                    Upper body assist        Lower Body Dressing/Undressing Lower body dressing                                  Lower body assist        Toileting Toileting   Toileting steps completed by patient: Adjust clothing prior to toileting, Performs perineal hygiene, Adjust clothing after toileting (wears gown)   Toileting Assistive Devices: Grab bar or rail  Toileting assist Assist level: Touching or steadying assistance (Pt.75%)   Transfers Chair/bed transfer     Chair/bed transfer assist  level: Touching or steadying assistance (Pt > 75%) Chair/bed transfer assistive device: Psychiatric nurseWalker     Locomotion Ambulation           Wheelchair          Cognition Comprehension Comprehension assist level: Follows complex conversation/direction with no assist  Expression Expression assist level: Expresses complex ideas: With no assist  Social Interaction Social Interaction assist level: Interacts appropriately with others - No medications needed.  Problem Solving Problem solving assist level: Solves complex problems: Recognizes & self-corrects  Memory Memory assist level: Complete Independence: No helper     Medical Problem List and Plan: 1.   Deficits in mobility, strength and skin care secondary to [facility].  Begin CIR 2.  DVT Prophylaxis/Anticoagulation: SCDs--no Lovenox due to hemoperitoneum/vascular injury.    LE dopplers due to edema/immobility, neg on 11/23 3. Pain Management: On robaxin tid and oxycodone prn, effective at this time.  4. Mood: Motivated to get better. LCSW to follow for evaluation and support. 5. Neuropsych: This patient is capable of making decisions on his own behalf. 6. Skin/Wound Care: Routine pressure relief measures. Monitor wound daily for healing.  7. Fluids/Electrolytes/Nutrition: Monitor I/O.   BMP within acceptable range 11/23 8. ABLA: Add iron supplement.   Hb 8.4 on 11/23 9. Orbital floor fracture/diplopia:  S/p ORIF diplopia improved.  10. Open right tibial pilon and fibular fracture:   s/p ORIF with increase in drainage from open area and evidence of cellulitis.    Surg to evaluate today with possible OR and VAC placement tomorrow 11. Periumbilical cellulitis: On Zosyn D #6 12. Pruritis: Question due to oxycodone vs zosyn.   Benadryl prn effective   Will monitor for now.   LOS (Days) 2 A FACE TO FACE EVALUATION WAS PERFORMED  Gregory Jordan 04/03/2016 9:26 AM
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
## 2016-04-03 NOTE — Progress Notes (Signed)
Occupational Therapy Session Note  Patient Details  Name: Gregory Jordan MRN: [id number] Date of Birth: 09/26/1965  Today's Date: 04/03/2016 OT Individual Time: 1100-1158 OT Individual Time Calculation (min): 58 min     Short Term Goals: Week 1:     Skilled Therapeutic Interventions/Progress Updates:    Pt seen this session to address toileting skills and tub bench transfers. Pt received in bed and therapist donned boot. Pt was able to transfer out of bed to RW with close S. Ambulated with RW with close S 10 ft to toilet maintaining NWB on R foot. Supervision using grabbar to toilet. For clothing management and cleansing, cues given to use R hand on bar for stabilzation to keep R foot NWB.  Used left hand while standing to cleanse.  Pt was successful cleansing himself well.  Self propelled over 300 ft in w/c to tub room to practice tub transfers with min A. Reviewed home layout of bathroom. Room is so small a RW may not fit.  His mother will bring photos of bathroom to show OT.   Pt self propelled back to room and transferred back into bed with RW with S. Pt in bed with all needs met. Lunch tray set up.      Therapy Documentation Precautions:  Precautions Precautions: Fall Restrictions Weight Bearing Restrictions: Yes RLE Weight Bearing: Non weight bearing Other Position/Activity Restrictions: keep RLE elevated    Vital Signs: Therapy Vitals Pulse Rate: (!) 116 BP: 140/80 Patient Position (if appropriate): Sitting (after ambulation) Oxygen Therapy SpO2: 96 % Pain: Pain Assessment Pain Assessment: 0-10 Pain Score: 4  Pain Type: Acute pain Pain Location: Foot Pain Orientation: Right Pain Descriptors / Indicators: Sore Pain Onset: On-going Pain Intervention(s): Repositioned;Rest ADL:   See Function Navigator for Current Functional Status.   Therapy/Group: Individual Therapy  [place] 04/03/2016, 1:03 PM
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
## 2016-04-03 NOTE — Care Management Note (Signed)
[facility] Individual Statement of Services  Patient Name:  Gregory Jordan  Date:  04/03/2016  Welcome to the [facility].  Our goal is to provide you with an individualized program based on your diagnosis and situation, designed to meet your specific needs.  With this comprehensive rehabilitation program, you will be expected to participate in at least 3 hours of rehabilitation therapies Monday-Friday, with modified therapy programming on the weekends.  Your rehabilitation program will include the following services:  Physical Therapy (PT), Occupational Therapy (OT), 24 hour per day rehabilitation nursing, Case Management (Social Worker), Rehabilitation Medicine, Nutrition Services and Pharmacy Services  Weekly team conferences will be held on Wednesdays to discuss your progress.  Your Social Worker will talk with you frequently to get your input and to update you on team discussions.  Team conferences with you and your family in attendance may also be held.  Expected length of stay: 7 days  Overall anticipated outcome: modified independent  Depending on your progress and recovery, your program may change. Your Social Worker will coordinate services and will keep you informed of any changes. Your Social Worker's name and contact numbers are listed  below.  The following services may also be recommended but are not provided by the [facility]:   Driving Evaluations  Home Health Rehabiltiation Services  Outpatient Rehabilitation Services  Vocational Rehabilitation   Arrangements will be made to provide these services after discharge if needed.  Arrangements include referral to agencies that provide these services.  Your insurance has been verified to be:  [facility] Your primary doctor is:  None  Pertinent information will be shared with your doctor and your insurance company.  Social Worker:  Dossie DerBecky Dupree, SW [phone] or  ([phone]) [phone]  Information discussed with and copy given to patient by: Amada JupiterHOYLE, [name], 04/03/2016, 1:38 PM
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
## 2016-04-04 ENCOUNTER — Inpatient Hospital Stay (HOSPITAL_COMMUNITY): Payer: 59 | Admitting: Occupational Therapy

## 2016-04-04 ENCOUNTER — Inpatient Hospital Stay (HOSPITAL_COMMUNITY): Payer: 59 | Admitting: Physical Therapy

## 2016-04-04 DIAGNOSIS — R030 Elevated blood-pressure reading, without diagnosis of hypertension: Secondary | ICD-10-CM

## 2016-04-04 NOTE — Progress Notes (Signed)
Occupational Therapy Session Note  Patient Details  Name: Gregory Jordan MRN: [id number] Date of Birth: Oct 28, 1965  Today's Date: 04/04/2016 OT Individual Time: 0912-1027 OT Individual Time Calculation (min): 75 min   Short Term Goals: Week 1:  OT Short Term Goal 1 (Week 1): STGs=LTGs due to ELOS  Skilled Therapeutic Interventions/Progress Updates:   Pt was lying in bed at time of arrival. Pt was agreeable to complete BADLs, but first needed to void. After CAM boot was donned, pt ambulated with Min A and RW to bathroom for toilet transfer and toileting. Cues for elevating R LE off of floor to maintain NWB. After toileting, ADLs completed w/c level at sink with setup. Pt provided LH sponge and reacher with education on proper uses to increase independence with bathing/dressing. Pt completed ADLs with overall steady assist. With extra time and instruction on adaptive technique, pt able to use reacher while donning/doffing CAM boot. D/c planning completed with pt-therapist collaboration regarding methods for elevating LEs in home environment during functional activities with verbalized understanding. Pt then placed clothing items away in drawers w/c level with instruction on safety with locking w/c. Began training with moving/removing w/c parts with pt continuing to benefit. At end of session pt was transferred to recliner. LEs elevated and pt was left with all needs within reach.   Therapy Documentation Precautions:  Precautions Precautions: Fall Required Braces or Orthoses: Other Brace/Splint Other Brace/Splint:  (CAM boot when OOB) Restrictions Weight Bearing Restrictions: Yes RLE Weight Bearing: Non weight bearing Other Position/Activity Restrictions: LEs elevated above heart between therapies  Pain: No c/o pain during session  Pain Assessment Pain Assessment: 0-10 Pain Score: 6  Pain Type: Acute pain Pain Location: Leg Pain Orientation: Right Pain Descriptors / Indicators:  Aching Pain Onset: On-going Pain Intervention(s): Medication (See eMAR) ADL: ADL ADL Comments: Please see functional navigator for ADL status :    See Function Navigator for Current Functional Status.   Therapy/Group: Individual Therapy  Royden Bulman A Shunte Senseney 04/04/2016, 12:43 PM

## 2016-04-04 NOTE — Progress Notes (Addendum)
[facility]IC[facility] & REHABILITATION     PROGRESS NOTE  Subjective/Complaints:  Pt seen sitting up in bed this AM.  He slept well overnight and had a good first day of therapies yesterday.  He also notes improvement in itching.   ROS: Denies CP, SOB, N/V/D.  Objective: Vital Signs: Blood pressure (!) 142/92, pulse 98, temperature 98.9 F (37.2 C), temperature source Oral, resp. rate 18, SpO2 100 %. No results found.  Recent Labs  04/02/16 0526  WBC 7.9  HGB 8.4*  HCT 27.4*  PLT 284    Recent Labs  04/02/16 0526  NA 138  K 4.3  CL 102  GLUCOSE 105*  BUN 7  CREATININE 0.95  CALCIUM 8.5*   CBG (last 3)  No results for input(s): GLUCAP in the last 72 hours.  Wt Readings from Last 3 Encounters:  03/22/16 130.2 kg (287 lb 0.6 oz)    Physical Exam:  BP (!) 142/92 (BP Location: Right Arm)   Pulse 98   Temp 98.9 F (37.2 C) (Oral)   Resp 18   SpO2 100%  Constitutional: He appears, well-developed. NAD. Obese  HENT: Normocephalic.   Eyes: Ecchymosis with edema right eye lid.   Cardiovascular: RRR. No JVD. Respiratory: Effort normal and breath sounds normal. No stridor. No respiratory distress. He has no wheezes.  GI: Soft. He exhibits distension. No tenderness.  Musculoskeletal: He exhibits edema and tenderness.   Neurological: He is alert and oriented.  Motor: RUE: 5/5 proximal to distal RLE: HF 3/5, distally limited by incisions/pain, able to wiggle toes LUE/LLE: 5/5 proximal to distal Skin:  Drainage right lateral ankle, open area.  Erythema along and in between both incisions, blisters improving.  Midline abd incision with staples intact, small open area with mild serous drainage.   Periumbilical cellulitis, improving Psychiatric: He has a normal mood and affect. His behavior is normal. Judgment and thought content normal.   Assessment/Plan: 1. Functional deficits secondary to polytrauma which require 3+ hours per day of interdisciplinary therapy  in a comprehensive inpatient rehab setting. Physiatrist is providing close team supervision and 24 hour management of active medical problems listed below. Physiatrist and rehab team continue to assess barriers to discharge/monitor patient progress toward functional and medical goals.  Function:  Bathing Bathing position   Position: Sitting EOB  Bathing parts Body parts bathed by patient: Right arm, Left arm, Chest, Abdomen, Front perineal area, Buttocks, Right upper leg, Left upper leg, Left lower leg Body parts bathed by helper: Right lower leg, Back  Bathing assist Assist Level: Touching or steadying assistance(Pt > 75%)      Upper Body Dressing/Undressing Upper body dressing   What is the patient wearing?: Pull over shirt/dress     Pull over shirt/dress - Perfomed by patient: Thread/unthread right sleeve, Thread/unthread left sleeve, Put head through opening          Upper body assist Assist Level: Supervision or verbal cues      Lower Body Dressing/Undressing Lower body dressing   What is the patient wearing?: Underwear, Pants, Non-skid slipper socks, Shoes Underwear - Performed by patient: Thread/unthread left underwear leg, Pull underwear up/down Underwear - Performed by helper: Thread/unthread right underwear leg Pants- Performed by patient: Thread/unthread right pants leg, Thread/unthread left pants leg, Pull pants up/down   Non-skid slipper socks- Performed by patient: Don/doff left sock Non-skid slipper socks- Performed by helper: Don/doff right sock     Shoes - Performed by patient: Don/doff left shoe, Fasten left  Lower body assist Assist for lower body dressing: Touching or steadying assistance (Pt > 75%)      Toileting Toileting   Toileting steps completed by patient: Adjust clothing prior to toileting, Performs perineal hygiene, Adjust clothing after toileting   Toileting Assistive Devices: Grab bar or rail  Toileting assist Assist level:  Touching or steadying assistance (Pt.75%)   Transfers Chair/bed transfer   Chair/bed transfer method: Stand pivot Chair/bed transfer assist level: Touching or steadying assistance (Pt > 75%) Chair/bed transfer assistive device: Armrests, Patent attorneyWalker     Locomotion Ambulation     Max distance: 50 ft Assist level: Touching or steadying assistance (Pt > 75%)   Wheelchair   Type: Manual Max wheelchair distance: 150 ft Assist Level: Supervision or verbal cues  Cognition Comprehension Comprehension assist level: Follows basic conversation/direction with no assist  Expression Expression assist level: Expresses complex ideas: With no assist  Social Interaction Social Interaction assist level: Interacts appropriately with others - No medications needed.  Problem Solving Problem solving assist level: Solves complex problems: Recognizes & self-corrects  Memory Memory assist level: Complete Independence: No helper     Medical Problem List and Plan: 1.  Deficits in mobility, strength and skin care secondary to [facility].  Cont CIR 2.  DVT Prophylaxis/Anticoagulation: SCDs--no Lovenox due to hemoperitoneum/vascular injury.    LE dopplers due to edema/immobility, neg on 11/23 3. Pain Management: On robaxin tid and oxycodone prn, effective at this time.  4. Mood: Motivated to get better. LCSW to follow for evaluation and support. 5. Neuropsych: This patient is capable of making decisions on his own behalf. 6. Skin/Wound Care: Routine pressure relief measures. Monitor wound daily for healing.  7. Fluids/Electrolytes/Nutrition: Monitor I/O.   BMP within acceptable range 11/23 8. ABLA: Add iron supplement.   Hb 8.4 on 11/23 9. Orbital floor fracture/diplopia:  S/p ORIF diplopia improved.  10. Open right tibial pilon and fibular fracture:   s/p ORIF with increase in drainage from open area and evidence of cellulitis.   11. Periumbilical cellulitis: On Zosyn D #7 12. Pruritis: Question due to  oxycodone vs zosyn.   Benadryl prn effective   Will monitor for now.  13. Elevated BP  Elevated this AM, otherwise in good control  Will cont to monitor  LOS (Days) 3 A FACE TO FACE EVALUATION WAS PERFORMED  Ankit Karis Jubanil Patel 04/04/2016 10:46 AM

## 2016-04-04 NOTE — Progress Notes (Signed)
Occupational Therapy Session Note  Patient Details  Name: Baxter KailMichael D Asmar MRN: [id number] Date of Birth: 1966-02-04  Today's Date: 04/04/2016 OT Individual Time: 1450-1550 OT Individual Time Calculation (min): 60 min   Short Term Goals: Week 1:  OT Short Term Goal 1 (Week 1): STGs=LTGs due to ELOS  Skilled Therapeutic Interventions/Progress Updates:   Skilled OT session completed with focus on w/c mgt/mobility, functional use of AE, activity tolerance, and standing endurance in community setting. Pt was received after PT handoff in gym, agreed to go outside for tx. Pt self propelled outdoors using bilateral UEs and two rest breaks. Pt completed w/c level task outdoors with use of reacher retrieving items off of ground, navigating on uneven terrain and up/down inclines with cues for w/c safety. Pt able to negotiate w/c parts with instruction and extra time. Functional ambulation completed to bench with RW and min guard, maintaining NWB precautions throughout, including sit<stand off of low bench. Afterwards pt self propelled back to room in manner as written above. Pt transferred back to bed, removed CAM boot with supervision, and was left with all needs within reach and LEs elevated above heart. Friend also present at time of departure.   Therapy Documentation Precautions:  Precautions Precautions: Fall Required Braces or Orthoses: Other Brace/Splint Other Brace/Splint:  (CAM boot when OOB) Restrictions Weight Bearing Restrictions: Yes RLE Weight Bearing: Non weight bearing Other Position/Activity Restrictions: LEs elevated above heart between therapies   Vital Signs: Therapy Vitals Temp: 98.7 F (37.1 C) Temp Source: Oral Pulse Rate: (!) 108 Resp: 18 BP: 129/84 Patient Position (if appropriate): Sitting Oxygen Therapy SpO2: 96 % O2 Device: Not Delivered Pain: No c/o pain during session    ADL: ADL ADL Comments: Please see functional navigator for ADL status    See  Function Navigator for Current Functional Status.  Therapy/Group: Individual Therapy  Pryce Folts A Shalan Neault 04/04/2016, 4:30 PM

## 2016-04-04 NOTE — Progress Notes (Signed)
Physical Therapy Session Note  Patient Details  Name: Gregory Jordan MRN: [id number] Date of Birth: 06-May-1966  Today's Date: 04/04/2016 PT Individual Time: 1425-1452 PT Individual Time Calculation (min): 27 min    Short Term Goals: Week 1:  PT Short Term Goal 1 (Week 1): = LTGs due to anticipated LOS  Skilled Therapeutic Interventions/Progress Updates:  Pt received seated in w/c with friends present.  No c/o pain and agreeable to PT.  Pt performed w/c mobility x 150' in controlled environment with supervision and therapist managing IV pole.  Discussed with pt plan for D/C; he states he will D/C to his mother's one level home with one small step to enter but hopes he will only be there one week and then be strong enough to return to his apartment which has 13 STE and one rail on L; pt's true goal is to be strong enough to D/C to his apartment straight from CIR.  Discussed reality of this with pt regarding hopping up/down 13 steps safely with one rail, strain on LLE and UE, risk of hitting RLE or falling, exiting in an emergency if alone, going home on IV antibiotics, meal prep, etc.  Pt continued to verbalize confidence in his ability to managing independently with RW once inside his apartment and states he won't be going out and that therapy can "come to him!".  Pt agreeable to try one 6" step with L rail.  Pt performed sit > stand with UE support on rails and min A and turned laterally to place bilat UE on L rail; pt unable to off load LLE sufficiently to hop to next step.  Discussed alternate method with L rail and crutch in RUE; demonstrated sequence to pt and had pt return demonstrate up/down 4 stairs leaning on L rail and crutch in RUE with mod A and verbal cues for sequence.  Pt very fatigued after 4 stairs and appeared to be more realistic about being able to negotiate 13 stairs at D/C.  Will continue to discuss.  Pt handed off to OT.     Therapy Documentation Precautions:   Precautions Precautions: Fall Required Braces or Orthoses: Other Brace/Splint Other Brace/Splint:  (CAM boot when OOB) Restrictions Weight Bearing Restrictions: Yes RLE Weight Bearing: Non weight bearing Other Position/Activity Restrictions: LEs elevated above heart between therapies Vital Signs: Therapy Vitals Temp: 98.7 F (37.1 C) Temp Source: Oral Pulse Rate: (!) 108 Resp: 18 BP: 129/84 Patient Position (if appropriate): Sitting Oxygen Therapy SpO2: 96 % O2 Device: Not Delivered Pain: Pain Assessment Pain Score: 4   See Function Navigator for Current Functional Status.   Therapy/Group: Individual Therapy  [name], Denym Christenberry [facility] 04/04/2016, 3:38 PM

## 2016-04-04 NOTE — Progress Notes (Signed)
Occupational Therapy Session Note  Patient Details  Name: Gregory Jordan MRN: [id number] Date of Birth: [birth year]  Today's Date: 04/04/2016 OT Individual Time: 1300-1345 OT Individual Time Calculation (min): 45 min     Short Term Goals:Week 1:  OT Short Term Goal 1 (Week 1): STGs=LTGs due to ELOS  Skilled Therapeutic Interventions/Progress Updates:    Pt seen this session to address functional mobility, balance, strength and activity tolerance. Pt received in low recliner, needed mod A to stand up from low surface. He then ambulated with RW (R NWB) to toilet. He needed a cue to keep R hand on bar and cleanse with L to ensure NWB on R foot.  Transferring back to w/c pt felt that he accidentally put wt on his foot.  No lasting pain with that movement.  Pt eager to go outside. Pt self propelled his w/c to [employer]	[place] 50% of the way and then self propelled outside on concrete pathway. Pt very anxious to get home soon and he does not want to have to stay at his mother's long. Discussed safe discharge plans and recommendations to prevent falls and reinjuring his R leg.  Pt propelled back to the room and wanted to stay in his w/c for awhile. Pt in room with all needs met.    Therapy Documentation Precautions:  Precautions Precautions: Fall Required Braces or Orthoses: Other Brace/Splint Other Brace/Splint:  (CAM boot when OOB) Restrictions Weight Bearing Restrictions: Yes RLE Weight Bearing: Non weight bearing Other Position/Activity Restrictions: LEs elevated above heart between therapies Therapy Vitals Temp: 98.7 F (37.1 C) Temp Source: Oral Pulse Rate: (!) 108 Resp: 18 BP: 129/84 Patient Position (if appropriate): Sitting Oxygen Therapy SpO2: 96 % O2 Device: Not Delivered Pain:  5/10 pain R foot, premedicated ADL: ADL ADL Comments: Please see functional navigator for ADL status  See Function Navigator for Current Functional Status.   Therapy/Group: Individual  Therapy  [place] 04/04/2016, 4:36 PM

## 2016-04-05 ENCOUNTER — Inpatient Hospital Stay (HOSPITAL_COMMUNITY): Payer: 59 | Admitting: Physical Therapy

## 2016-04-05 ENCOUNTER — Inpatient Hospital Stay (HOSPITAL_COMMUNITY): Payer: 59 | Admitting: Occupational Therapy

## 2016-04-05 DIAGNOSIS — S0231XS Fracture of orbital floor, right side, sequela: Secondary | ICD-10-CM

## 2016-04-05 NOTE — Progress Notes (Signed)
Occupational Therapy Session Note  Patient Details  Name: Gregory Jordan MRN: [id number] Date of Birth: [birth year]  Today's Date: 04/05/2016 OT Individual Time: 1415-1452 and 1000-1057 OT Individual Time Calculation (min): 37 min and 57 minutes  Short Term Goals: Week 1:  OT Short Term Goal 1 (Week 1): STGs=LTGs due to ELOS  Skilled Therapeutic Interventions/Progress Updates:   Pt was lying in bed at time of arrival, declined ADLs but agreeable to tx. Pt completed supine<sit with supervision, donned CAM boot (with use of reacher) and left sock and shoe with extra time and supervision. Pt reported feeling "groggy" and wanted to self propel to therapy apartment instead of hopping partway. Pt did so in w/c (after putting together w/c parts) while avoiding environmental barriers down hallway. Once in apartment, pt completed simulated meal prep w/c level and with walker with instruction on safety, kitchen modifications, and adaptive techniques. Pt reported that both his mother and his home kitchen is w/c accessible. Pt plans to move back into his home in early December and will need to be Mod I for everything but laundry (which his sister will complete). Afterwards, pt self propelled back to room and was transferred to bed. Due to c/o poor sleep, pt provided wash cloth for eyes (for mock face mask) and used oceanic sound channel for inducing sleep. R LE elevated above heart, and pt left with all needs within reach at time of departure.   2nd Session 1:1 Tx (37 minutes) Pt was sitting in bed at time of arrival, had just had family visiting and exhibited increased psychosocial wellness. Reported that seeing them increased his motivation. Pt agreeable to tx in room. Bedmaking completed w/c level (with CAM boot donned) with adaptive technique, and pt navigating w/c in tight spaces for carryover during IADL completion at home. Discussion completed regarding bedmaking at home and utilization of adaptive  techniques w/c level with verbalized understanding. Afterwards pt was transferred to recliner and left with all needs within reach and R LE elevated above heart. DME needs also discussed today.  Therapy Documentation Precautions:  Precautions Precautions: Fall Required Braces or Orthoses: Other Brace/Splint Other Brace/Splint:  (CAM boot when OOB) Restrictions Weight Bearing Restrictions: Yes RLE Weight Bearing: Non weight bearing Other Position/Activity Restrictions: LEs elevated above heart between therapies General:   Vital Signs: Therapy Vitals Temp: 98.2 F (36.8 C) Temp Source: Oral Pulse Rate: 97 Resp: 18 BP: 119/70 Patient Position (if appropriate): Lying Oxygen Therapy SpO2: 100 % O2 Device: Not Delivered Pain: No c/o pain during sessions  Pain Assessment Pain Assessment: No/denies pain Pain Score: 3  Pain Type: Acute pain Pain Location: Leg Pain Orientation: Right Pain Descriptors / Indicators: Aching Pain Onset: On-going Pain Intervention(s): Medication (See eMAR) ADL: ADL ADL Comments: Please see functional navigator for ADL status    See Function Navigator for Current Functional Status.   Therapy/Group: Individual Therapy  Gregory Jordan Gregory Jordan 04/05/2016, 4:09 PM

## 2016-04-05 NOTE — Progress Notes (Signed)
Physical Therapy Session Note  Patient Details  Name: Baxter KailMichael D Medaglia MRN: [id number] Date of Birth: [birth year]  Today's Date: 04/05/2016 PT Individual Time: [id number] and [id number] PT Individual Time Calculation (min): 58 min and 42 min   Short Term Goals: Week 1:  PT Short Term Goal 1 (Week 1): = LTGs due to anticipated LOS  Skilled Therapeutic Interventions/Progress Updates:    Treatment 1: Patient in bed upon arrival reporting pain level "not bad," but did not rate. Patient sat EOB for UB/LB dressing with setup assist and supervision for sit <> stand and standing balance while pulling shorts over hips. Patient declined donning CAM boot before ambulating to bathroom despite cues for wearing boot when OOB. Patient ambulated to toilet using RW and performed toileting tasks with supervision for clothing management and assist for hygiene for thoroughness after bowel movement. In wheelchair, patient leaned forward to don CAM boot causing wheelchair to tip forward but patient caught himself and returned to upright without RLE hitting floor. Patient required assist to don CAM boot in wheelchair safely, donned L shoe using reacher with assist to tie laces. Patient re-educated on donning boot in bed before transferring OOB for safety. Performed grooming tasks at sink from wheelchair level with mod I. Patient propelled wheelchair to and from gym using BUE and required supervision for donning/doffing leg rests. Performed ambulatory transfer using RW and bed mobility on mat table with supervision. Instructed in LE therex for strengthening and ROM: seated RLE LAQ x 15, supine glute sets x 20, supine B heel slides x 20 each LE with maxislide, L ankle pumps to fatigue, and R quad sets x 20. Patient left sitting up in wheelchair with all needs within reach.   Treatment 2: Patient in bed with RLE elevated. Donned CAM boot sitting EOB with use of reacher with increased time. Gait training from room to hallway  using RW x 50 ft with supervision and verbal cues for upright posture/forward gaze. Patient propelled wheelchair the rest of way to gym with supervision. Standing LE therex for strengthening and activity tolerance using RW for BUE support: L heel raises x 20, R hip flexion x 20, R knee flexion x 20. Discussed discharge planning as patient reports his mother is having knee replacement surgery tomorrow but his sister and aunt may be able to provide intermittent assistance as well as return to work and managing expectations. Patient reports he knows he is not ready for stairs to reach his apartment and will stay at mother's with ramp. Performed Sci Fit arm bike at level 4 x 5 min alternating forwards and backwards every 60 sec. Patient propelled wheelchair back to room, ambulated using RW to and from bathroom for toileting tasks, and performed hand hygiene standing at sink with supervision. Patient returned to bed and left with RLE elevated and needs in reach.   Therapy Documentation Precautions:  Precautions Precautions: Fall Required Braces or Orthoses: Other Brace/Splint Other Brace/Splint:  (CAM boot when OOB) Restrictions Weight Bearing Restrictions: Yes RLE Weight Bearing: Non weight bearing Other Position/Activity Restrictions: LEs elevated above heart between therapies Pain: Pain Assessment Pain Assessment: 0-10 Pain Score: 4  Pain Type: Acute pain Pain Location: Leg Pain Orientation: Right Pain Descriptors / Indicators: Aching Pain Onset: On-going Pain Intervention(s): Repositioned;Rest  See Function Navigator for Current Functional Status.   Therapy/Group: Individual Therapy  Kerney ElbeVarner, Kenderick Kobler A 04/05/2016, 9:00 AM

## 2016-04-05 NOTE — Progress Notes (Signed)
[place] PHYSICAL MEDICINE & REHABILITATION     PROGRESS NOTE  Subjective/Complaints:  Pt seen laying in bed this AM.  He slept fairly overnight.  He is pleased with the way his wound looks and with therapies.   ROS: Denies CP, SOB, N/V/D.  Objective: Vital Signs: Blood pressure 119/70, pulse 97, temperature 98.2 F (36.8 C), temperature source Oral, resp. rate 18, SpO2 100 %. No results found. No results for input(s): WBC, HGB, HCT, PLT in the last 72 hours. No results for input(s): NA, K, CL, GLUCOSE, BUN, CREATININE, CALCIUM in the last 72 hours.  Invalid input(s): CO CBG (last 3)  No results for input(s): GLUCAP in the last 72 hours.  Wt Readings from Last 3 Encounters:  03/22/16 130.2 kg (287 lb 0.6 oz)    Physical Exam:  BP 119/70 (BP Location: Left Arm)   Pulse 97   Temp 98.2 F (36.8 C) (Oral)   Resp 18   SpO2 100%  Constitutional: He appears, well-developed. NAD. Obese  HENT: Normocephalic.   Eyes: Ecchymosis with edema right eye lid.   Cardiovascular: RRR. No JVD. Respiratory: Effort normal and breath sounds normal.  GI: Soft. He exhibits distension. No tenderness.  Musculoskeletal: He exhibits edema and tenderness.   Neurological: He is alert and oriented.  Motor: RUE: 5/5 proximal to distal RLE: HF 3/5, distally limited by incisions/pain, able to wiggle toes LUE/LLE: 5/5 proximal to distal Skin:  Minimal drainage right lateral ankle, open area.  Erythema along and in between both incisions, blisters resolved.  Midline abd incision with staples intact, small open area with mild drainage.   Periumbilical cellulitis, improving Psychiatric: He has a normal mood and affect. His behavior is normal. Judgment and thought content normal.   Assessment/Plan: 1. Functional deficits secondary to polytrauma which require 3+ hours per day of interdisciplinary therapy in a comprehensive inpatient rehab setting. Physiatrist is providing close team supervision and 24  hour management of active medical problems listed below. Physiatrist and rehab team continue to assess barriers to discharge/monitor patient progress toward functional and medical goals.  Function:  Bathing Bathing position   Position: Wheelchair/chair at sink  Bathing parts Body parts bathed by patient: Right arm, Left arm, Chest, Abdomen, Front perineal area, Buttocks, Right upper leg, Left upper leg, Left lower leg, Right lower leg, Back Body parts bathed by helper: Right lower leg, Back  Bathing assist Assist Level: Touching or steadying assistance(Pt > 75%)      Upper Body Dressing/Undressing Upper body dressing   What is the patient wearing?: Pull over shirt/dress     Pull over shirt/dress - Perfomed by patient: Thread/unthread right sleeve, Thread/unthread left sleeve, Put head through opening, Pull shirt over trunk          Upper body assist Assist Level: Set up      Lower Body Dressing/Undressing Lower body dressing   What is the patient wearing?: Underwear, Pants, Non-skid slipper socks Underwear - Performed by patient: Thread/unthread left underwear leg, Pull underwear up/down, Thread/unthread right underwear leg Underwear - Performed by helper: Thread/unthread right underwear leg Pants- Performed by patient: Thread/unthread right pants leg, Thread/unthread left pants leg, Pull pants up/down   Non-skid slipper socks- Performed by patient: Don/doff left sock Non-skid slipper socks- Performed by helper: Don/doff right sock     Shoes - Performed by patient: Don/doff left shoe, Fasten left            Lower body assist Assist for lower body dressing: Touching or  steadying assistance (Pt > 75%)      Toileting Toileting   Toileting steps completed by patient: Adjust clothing prior to toileting, Adjust clothing after toileting Toileting steps completed by helper: Performs perineal hygiene Toileting Assistive Devices: Grab bar or rail  Toileting assist Assist  level: Supervision or verbal cues   Transfers Chair/bed transfer   Chair/bed transfer method: Stand pivot, Ambulatory Chair/bed transfer assist level: Supervision or verbal cues Chair/bed transfer assistive device: Armrests, Patent attorneyWalker     Locomotion Ambulation     Max distance: 50 ft Assist level: Supervision or verbal cues   Wheelchair   Type: Manual Max wheelchair distance: 150 ft Assist Level: Supervision or verbal cues  Cognition Comprehension Comprehension assist level: Follows complex conversation/direction with no assist  Expression Expression assist level: Expresses complex ideas: With no assist  Social Interaction Social Interaction assist level: Interacts appropriately with others - No medications needed.  Problem Solving Problem solving assist level: Solves complex problems: Recognizes & self-corrects  Memory Memory assist level: Complete Independence: No helper     Medical Problem List and Plan: 1.  Deficits in mobility, strength and skin care [facility]  Cont CIR 2.  DVT Prophylaxis/Anticoagulation: SCDs--no Lovenox due to hemoperitoneum/vascular injury.    LE dopplers due to edema/immobility, neg on 11/23 3. Pain Management: On robaxin tid and oxycodone prn, effective at this time.  4. Mood: Motivated to get better. LCSW to follow for evaluation and support. 5. Neuropsych: This patient is capable of making decisions on his own behalf. 6. Skin/Wound Care: Routine pressure relief measures. Monitor wound daily for healing.  7. Fluids/Electrolytes/Nutrition: Monitor I/O.   BMP within acceptable range 11/23  Labs ordered for tomorrow 8. ABLA: Add iron supplement.   Hb 8.4 on 11/23  Labs ordered for tomorrow 9. Orbital floor fracture/diplopia:  S/p ORIF diplopia continues to improve. 10. Open right tibial pilon and fibular fracture:   s/p ORIF with drainage from open area and evidence of cellulitis, improving.    Surg evaluated, elevation, no further  recs. 11. Periumbilical cellulitis: On Zosyn D #8 12. Pruritis: Question due to oxycodone vs zosyn.   Benadryl prn effective   Improving 13. Elevated BP: Resolved  LOS (Days) 4 A FACE TO FACE EVALUATION WAS PERFORMED  Gregory Jordan 04/05/2016 2:51 PM

## 2016-04-06 ENCOUNTER — Inpatient Hospital Stay (HOSPITAL_COMMUNITY): Payer: 59

## 2016-04-06 ENCOUNTER — Inpatient Hospital Stay (HOSPITAL_COMMUNITY): Payer: 59 | Admitting: Occupational Therapy

## 2016-04-06 ENCOUNTER — Inpatient Hospital Stay (HOSPITAL_COMMUNITY): Payer: 59 | Admitting: Physical Therapy

## 2016-04-06 LAB — BASIC METABOLIC PANEL
Anion gap: 8 (ref 5–15)
BUN: 8 mg/dL (ref 6–20)
CALCIUM: 8.8 mg/dL — AB (ref 8.9–10.3)
CHLORIDE: 108 mmol/L (ref 101–111)
CO2: 24 mmol/L (ref 22–32)
CREATININE: 0.87 mg/dL (ref 0.61–1.24)
Glucose, Bld: 89 mg/dL (ref 65–99)
Potassium: 3.9 mmol/L (ref 3.5–5.1)
SODIUM: 140 mmol/L (ref 135–145)

## 2016-04-06 LAB — CBC
HCT: 33.5 % — ABNORMAL LOW (ref 39.0–52.0)
HEMOGLOBIN: 10.1 g/dL — AB (ref 13.0–17.0)
MCH: 27.8 pg (ref 26.0–34.0)
MCHC: 30.1 g/dL (ref 30.0–36.0)
MCV: 92.3 fL (ref 78.0–100.0)
PLATELETS: 424 10*3/uL — AB (ref 150–400)
RBC: 3.63 MIL/uL — ABNORMAL LOW (ref 4.22–5.81)
RDW: 16.5 % — ABNORMAL HIGH (ref 11.5–15.5)
WBC: 6 10*3/uL (ref 4.0–10.5)

## 2016-04-06 NOTE — Progress Notes (Signed)
[place] PHYSICAL MEDICINE & REHABILITATION     PROGRESS NOTE  Subjective/Complaints:  Pt laying in bed this AM.  He states he slept very well last night.    ROS: Denies CP, SOB, N/V/D.  Objective: Vital Signs: Blood pressure 128/76, pulse 87, temperature 98.1 F (36.7 C), temperature source Oral, resp. rate 18, SpO2 100 %. No results found.  Recent Labs  04/06/16 0803  WBC 6.0  HGB 10.1*  HCT 33.5*  PLT 424*    Recent Labs  04/06/16 0803  NA 140  K 3.9  CL 108  GLUCOSE 89  BUN 8  CREATININE 0.87  CALCIUM 8.8*   CBG (last 3)  No results for input(s): GLUCAP in the last 72 hours.  Wt Readings from Last 3 Encounters:  03/22/16 130.2 kg (287 lb 0.6 oz)    Physical Exam:  BP 128/76 (BP Location: Left Arm)   Pulse 87   Temp 98.1 F (36.7 C) (Oral)   Resp 18   SpO2 100%  Constitutional: He appears, well-developed. NAD. Obese  HENT: Normocephalic.   Eyes: Ecchymosis with edema right eye lid, improving.   Cardiovascular: RRR. No JVD. Respiratory: Effort normal and breath sounds normal.  GI: Soft. He exhibits distension. No tenderness.  Musculoskeletal: He exhibits edema and tenderness.   Neurological: He is alert and oriented.  Motor: RUE: 5/5 proximal to distal RLE: HF 3+/5, distally limited by incisions/pain, able to wiggle toes LUE/LLE: 5/5 proximal to distal Skin:  Minimal drainage right lateral ankle, open area.  Erythema along and in between both incisions, blisters resolved.  Midline abd incision with staples intact, small open area with mild drainage.   Periumbilical cellulitis, improving Psychiatric: He has a normal mood and affect. His behavior is normal. Judgment and thought content normal.   Assessment/Plan: 1. Functional deficits secondary to polytrauma which require 3+ hours per day of interdisciplinary therapy in a comprehensive inpatient rehab setting. Physiatrist is providing close team supervision and 24 hour management of active medical  problems listed below. Physiatrist and rehab team continue to assess barriers to discharge/monitor patient progress toward functional and medical goals.  Function:  Bathing Bathing position   Position: Wheelchair/chair at sink  Bathing parts Body parts bathed by patient: Right arm, Left arm, Chest, Abdomen, Front perineal area, Buttocks, Right upper leg, Left upper leg, Left lower leg, Right lower leg, Back Body parts bathed by helper: Right lower leg, Back  Bathing assist Assist Level: Touching or steadying assistance(Pt > 75%)      Upper Body Dressing/Undressing Upper body dressing   What is the patient wearing?: Pull over shirt/dress     Pull over shirt/dress - Perfomed by patient: Thread/unthread right sleeve, Thread/unthread left sleeve, Put head through opening, Pull shirt over trunk          Upper body assist Assist Level: Set up      Lower Body Dressing/Undressing Lower body dressing   What is the patient wearing?: Underwear, Pants, Non-skid slipper socks Underwear - Performed by patient: Thread/unthread left underwear leg, Pull underwear up/down, Thread/unthread right underwear leg Underwear - Performed by helper: Thread/unthread right underwear leg Pants- Performed by patient: Thread/unthread right pants leg, Thread/unthread left pants leg, Pull pants up/down   Non-skid slipper socks- Performed by patient: Don/doff left sock Non-skid slipper socks- Performed by helper: Don/doff right sock     Shoes - Performed by patient: Don/doff left shoe, Fasten left            Lower body  assist Assist for lower body dressing: Touching or steadying assistance (Pt > 75%)      Toileting Toileting   Toileting steps completed by patient: Adjust clothing prior to toileting, Performs perineal hygiene, Adjust clothing after toileting Toileting steps completed by helper: Performs perineal hygiene Toileting Assistive Devices: Grab bar or rail  Toileting assist Assist level:  Supervision or verbal cues   Transfers Chair/bed transfer   Chair/bed transfer method: Stand pivot, Ambulatory Chair/bed transfer assist level: Supervision or verbal cues Chair/bed transfer assistive device: Armrests, Patent attorneyWalker     Locomotion Ambulation     Max distance: 50 ft Assist level: Supervision or verbal cues   Wheelchair   Type: Manual Max wheelchair distance: 150 ft Assist Level: Supervision or verbal cues  Cognition Comprehension Comprehension assist level: Follows complex conversation/direction with no assist  Expression Expression assist level: Expresses complex ideas: With no assist  Social Interaction Social Interaction assist level: Interacts appropriately with others - No medications needed.  Problem Solving Problem solving assist level: Solves complex problems: Recognizes & self-corrects  Memory Memory assist level: Complete Independence: No helper     Medical Problem List and Plan: 1.  Deficits in mobility, strength and skin care [facility].  Cont CIR 2.  DVT Prophylaxis/Anticoagulation: SCDs--no Lovenox due to hemoperitoneum/vascular injury.    LE dopplers due to edema/immobility, neg on 11/23 3. Pain Management: On robaxin tid and oxycodone prn, effective at this time.  4. Mood: Motivated to get better. LCSW to follow for evaluation and support. 5. Neuropsych: This patient is capable of making decisions on his own behalf. 6. Skin/Wound Care: Routine pressure relief measures. Monitor wound daily for healing.  7. Fluids/Electrolytes/Nutrition: Monitor I/O.   BMP within acceptable range 11/27 8. ABLA: Add iron supplement.   Hb 10.1 on 11/27 9. Orbital floor fracture/diplopia:  S/p ORIF diplopia continues to improve. 10. Open right tibial pilon and fibular fracture:   s/p ORIF with drainage from open area and evidence of cellulitis, improving.    Surg evaluated, elevation, no further recs. 11. Periumbilical cellulitis: On Zosyn D #9 12.  Pruritis: Question due to oxycodone vs zosyn.   Benadryl prn effective   Improving 13. Elevated BP: Resolved  LOS (Days) 5 A FACE TO FACE EVALUATION WAS PERFORMED  Ankit Karis Jubanil Patel 04/06/2016 9:13 AM

## 2016-04-06 NOTE — Progress Notes (Signed)
Physical Therapy Session Note  Patient Details  Name: Gregory Jordan MRN: [id number] Date of Birth: March 19, 1966  Today's Date: 04/06/2016 PT Individual Time: [id number] PT Individual Time Calculation (min): 30 min    Short Term Goals: Week 1:  PT Short Term Goal 1 (Week 1): = LTGs due to anticipated LOS  Skilled Therapeutic Interventions/Progress Updates:    Pt request to stay in room until after bathing and dressing occurs (session immediately following this session), so focused on supine and standing LE therex for functional strengthening, sit <> stands, and d/c planning education. Pt completed bed mobility at modified independent level using hospital bed functions. Instructed in RLE supine heel slides, hip abduction and bilateral glute/quad sets x 15 reps each. In standing completed RLE hip marching, hip extension, and L heel raises and mini squats x 15 reps each. Pt completed sit <> stand x 2 occasions with close supervision using RW and able to maintain NWB during activities. Pt requested to be weighed- used standing scale (supervision for sit <> stand). Discussed home set-up, goals (his mom's house initially and then for going back to his home and work), and continued therapies.    Therapy Documentation Precautions:  Precautions Precautions: Fall Required Braces or Orthoses: Other Brace/Splint Other Brace/Splint:  (CAM boot when OOB) Restrictions Weight Bearing Restrictions: Yes RLE Weight Bearing: Non weight bearing Other Position/Activity Restrictions: LEs elevated above heart between therapies Pain: Premedicated for RLE and R eye socket pain rated at a 4/10   See Function Navigator for Current Functional Status.   Therapy/Group: Individual Therapy  Karolee StampsGray, Francess Mullen Darrol PokeBrescia  Lailah Marcelli B. Steffon Gladu, PT, DPT  04/06/2016, 9:57 AM

## 2016-04-06 NOTE — Progress Notes (Signed)
Occupational Therapy Session Note  Patient Details  Name: Gregory Jordan MRN: [id number] Date of Birth: Apr 03, 1966  Today's Date: 04/06/2016  Session 1 OT Individual Time: 1000-1100 OT Individual Time Calculation (min): 60 min   Session 2 OT Individual Time: 1415-1500 OT Individual Time Calculation (min): 45 min    Short Term Goals:Week 1:  OT Short Term Goal 1 (Week 1): STGs=LTGs due to ELOS  Skilled Therapeutic Interventions/Progress Updates:  Session 1   1:1 OT session focused on modified bathing/dressing and functional transfers. Pt completed stand-pivot transfer from bed>w/c with CGA and cues to reach back for controlled sit to w/c. Pt propelled w/c to the sink and completed bathing/grooming/dressing w/ set-up A for clothing and bathing supplies. OT provided pt with long-handled sponge and pt able to wash back and R Le. ADL AE used appropriately with min cues for technique.  Min A for standing balance to wash buttocks and pull pants over hips and Min A to don CAM boot. Pt left seated in w.c at end of session with needs met and call bell within reach.  Session 2 1:1 OT session focused on activity tolerance, sit<>stands, and dynamic standing balance. Pt propelled w/c from room to therapy gym, then ambulated 3 feet to therapy mat. Utilized basketball goal and horse shoes for dynamic standing balance activity. Pt required cues for safe descent onto mat and intermittent Min A to maintain standing balance when reaching outside base of support to R. Pt then ambulated 20 ft with RW and NWB R LE with CGA and 2 standing rest breaks. Showed pt tub/transfer bench in therapy apartment and pt reports his mother has a tub/transfer bench pt can utilize. OT demonstrated tub/shower transfer, but pt declined to practice 2/2 fatigue.  Pt propelled wc back to room and left with needs met and call bell within reach.   Therapy Documentation Precautions:  Precautions Precautions: Fall Required Braces or  Orthoses: Other Brace/Splint Other Brace/Splint:  (CAM boot when OOB) Restrictions Weight Bearing Restrictions: Yes RLE Weight Bearing: Non weight bearing Other Position/Activity Restrictions: LEs elevated above heart between therapies Pain: Pain Assessment Pain Assessment: 0-10 Pain Score: 3  Pain Type: Acute pain Pain Location: Leg Pain Orientation: Right Pain Intervention(s): Medication (See eMAR) ADL: ADL ADL Comments: Please see functional navigator for ADL status  See Function Navigator for Current Functional Status.   Therapy/Group: Individual Therapy  Valma Cava 04/06/2016, 3:47 PM

## 2016-04-06 NOTE — Progress Notes (Signed)
PRN trazodone 25mg  and ultram given at HS, per patient's request. Reports sleeping better last night.Gregory MartinezMurray, Gregory Jordan

## 2016-04-06 NOTE — Progress Notes (Addendum)
Physical Therapy Session Note  Patient Details  Name: Gregory Jordan MRN: [id number] Date of Birth: Dec 23, 1965  Today's Date: 04/06/2016 PT Individual Time: 1310-1410 PT Individual Time Calculation (min): 60 min    Short Term Goals: Week 1:  PT Short Term Goal 1 (Week 1): = LTGs due to anticipated LOS  Skilled Therapeutic Interventions/Progress Updates:  Pt received in w/c; no c/o pain.  Pt wishing to try stairs with crutch again.  Performed w/c mobility x 200' with bilat UE propulsion and mod I.  Performed ramp negotiation up/down with RW and supervision and up/down x 2 reps with w/c with supervision-min A.  Returned to big gym in w/c and performed stair negotiation up/down 4 larger stairs (6") with L rail and one crutch with therapist reviewing sequence and pt return demonstrating with mod A overall.  Performed gait with RW x 25' with supervision over level surfaces.  Performed bilat UE and LLE strengthening and endurance training on Nustep with RLE off side propped up on wedge; resistance set at level 7 x 10 Min.  Performed sit <> stand from elevated seat with fingertip support on RW for LE strengthening and balance x 10 reps.  Provided pt with handout of seated and standing HEP and explained each exercise to pt.  Pt returned to room in w/c Mod I; pt left with all items within reach.   Therapy Documentation Precautions:  Precautions Precautions: Fall Required Braces or Orthoses: Other Brace/Splint Other Brace/Splint:  (CAM boot when OOB) Restrictions Weight Bearing Restrictions: Yes RLE Weight Bearing: Non weight bearing Other Position/Activity Restrictions: LEs elevated above heart between therapies Pain: Pain Assessment Pain Assessment: 0-10 Pain Score: 3  Pain Type: Acute pain Pain Location: Leg Pain Orientation: Right Pain Intervention(s): Medication (See eMAR)  See Function Navigator for Current Functional Status.   Therapy/Group: Individual Therapy  [name], [name]  [name] 04/06/2016, 2:58 PM

## 2016-04-07 ENCOUNTER — Inpatient Hospital Stay (HOSPITAL_COMMUNITY): Payer: 59 | Admitting: Physical Therapy

## 2016-04-07 ENCOUNTER — Inpatient Hospital Stay (HOSPITAL_COMMUNITY): Payer: 59 | Admitting: Occupational Therapy

## 2016-04-07 NOTE — Progress Notes (Signed)
Physical Therapy Session Note  Patient Details  Name: Gregory Jordan MRN: [id number] Date of Birth: [birth year]  Today's Date: 04/07/2016 PT Individual Time: 0800-0857 PT Individual Time Calculation (min): 57 min   Short Term Goals: Week 1:  PT Short Term Goal 1 (Week 1): = LTGs due to anticipated LOS  Skilled Therapeutic Interventions/Progress Updates:    Patient in bed requesting to get cleaned up before leaving room. Patient sat EOB with mod I using hospital bed functions and donned R CAM boot using reacher with set up and extra time. Performed stand pivot transfer to wheelchair using RW with supervision and cues for hand placement. Patient propelled wheelchair to sink for grooming and modified bathing/dressing with setup assist and increased time and retrieved clothing from wheelchair level with good safety awareness, locking brakes before reaching into drawers. Patient used LH sponge to wash back. Patient pulled up underwear/shorts with sit <> stand using RW with supervision with good safety awareness with moving leg rests out of the way. RN present to disconnect IV and patient donned shirt in wheelchair. Patient propelled wheelchair 2 x > 300 ft with mod I and demonstrated donning/doffing B leg rests with increased time. Gait training using RW in controlled environment and up/down ramp with supervision x 50 ft + 20 ft. Performed simulated car transfer to sedan height using RW with supervision. Continued discharge planning for DME needs, f/u HHPT, and HEP as well as general healthy lifestyle changes including exercise and healthy diet. Patient left sitting in wheelchair with all needs in reach.  Therapy Documentation Precautions:  Precautions Precautions: Fall Required Braces or Orthoses: Other Brace/Splint Other Brace/Splint:  (CAM boot when OOB) Restrictions Weight Bearing Restrictions: Yes RLE Weight Bearing: Non weight bearing Other Position/Activity Restrictions: LEs elevated  above heart between therapies Pain: Pain Assessment Pain Assessment: 0-10 Pain Score: 2  Pain Type: Acute pain Pain Location: Face Pain Orientation: Right Pain Descriptors / Indicators: Aching Pain Frequency: Intermittent Pain Onset: On-going Patients Stated Pain Goal: 3 Pain Intervention(s): Emotional support  See Function Navigator for Current Functional Status.   Therapy/Group: Individual Therapy  Kerney ElbeVarner, Gregory Jordan 04/07/2016, 8:58 AM

## 2016-04-07 NOTE — Progress Notes (Signed)
Leg swelling better. Only trace serosang drainage since yesterday size of a dime.  OK to stop ABX.  Continue elevation and therapy.   Thank you. [phone]
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
## 2016-04-07 NOTE — Progress Notes (Signed)
Occupational Therapy Session Note  Patient Details  Name: Gregory Jordan MRN: [id number] Date of Birth: 1965/07/25  Today's Date: 04/07/2016  Session 1 OT Individual Time: 1100-1158 OT Individual Time Calculation (min): 58 min   Session 2 OT Individual Time: 1415-1530 OT Individual Time Calculation (min): 75 min   Short Term Goals: Week 1:  OT Short Term Goal 1 (Week 1): STGs=LTGs due to ELOS  Skilled Therapeutic Interventions/Progress Updates:    Session 1 1:1 OT session focused on transfer training, activity tolerance, standing endurance, and standing balance utilizing Dynavision. Pt tolerated 1 minute standing bouts x3, with close supervision and intermittent CGA to correct lateral loss of balance to R. Pt tolerated 2  Minutes at longest standing bout, while reaching in all 4 quadrantsR alternating unilateral UE support. Pt reaching max fatigue and required seated rest break. Pt propelled wc to therapy apartment ambulated 10 ft into bathroom w/ CGA, and completed tub bench transfer with verbal cues for technique. Pt propelled wc back to room, stand-pivot transfer back to bed, and doffed CAM boot per OT instruction for technique. Pt left semi-reclined in bed with R LE elevated and needs met.   Session 2 1:1 OT session focused on activity tolerance, UB strengthening, dynamic standing balance/endurance, and w/c mobility. 15 minute UB there-ex on arm bike on level 2.5 + RPMs above 30. Pt required 1 rest break. Provided pt with walker bag and educated on functional use during IADL activities. Standing balance/endurance using horse shoes, incorporating use of reacher to grasp horse shoes from ground and place into walker bag. Pt then ambulated 15  feet x2 w/ RW and close supervision. Educated pt on w/c management and technique to access elevators safely within community environment. Pt propelled w/c outside and was able to maneuver across graded incline and multiple surfaces. Pt returned to  room and end of session and was left semi-reclined in bed with needs met.   Therapy Documentation Precautions:  Precautions Precautions: Fall Required Braces or Orthoses: Other Brace/Splint Other Brace/Splint:  (CAM boot when OOB) Restrictions Weight Bearing Restrictions: Yes RLE Weight Bearing: Non weight bearing Other Position/Activity Restrictions: LEs elevated above heart between therapies Pain:   ADL: ADL ADL Comments: Please see functional navigator for ADL status  See Function Navigator for Current Functional Status.   Therapy/Group: Individual Therapy  Valma Cava 04/07/2016, 3:38 PM

## 2016-04-08 ENCOUNTER — Encounter (HOSPITAL_COMMUNITY): Payer: 59 | Admitting: Physical Therapy

## 2016-04-08 ENCOUNTER — Inpatient Hospital Stay (HOSPITAL_COMMUNITY): Payer: 59 | Admitting: Occupational Therapy

## 2016-04-08 ENCOUNTER — Inpatient Hospital Stay (HOSPITAL_COMMUNITY): Payer: 59 | Admitting: Physical Therapy

## 2016-04-08 DIAGNOSIS — H532 Diplopia: Secondary | ICD-10-CM

## 2016-04-08 LAB — BASIC METABOLIC PANEL
ANION GAP: 10 (ref 5–15)
BUN: 10 mg/dL (ref 6–20)
CO2: 26 mmol/L (ref 22–32)
Calcium: 9 mg/dL (ref 8.9–10.3)
Chloride: 103 mmol/L (ref 101–111)
Creatinine, Ser: 0.93 mg/dL (ref 0.61–1.24)
GFR calc Af Amer: >60 mL/min (ref >60)
GLUCOSE: 102 mg/dL — AB (ref 65–99)
POTASSIUM: 4.2 mmol/L (ref 3.5–5.1)
Sodium: 139 mmol/L (ref 135–145)

## 2016-04-08 NOTE — Progress Notes (Signed)
Occupational Therapy Discharge Summary  Patient Details  Name: JONH MCQUEARY MRN: [id number] Date of Birth: [birth year]  Today's Date: 04/08/2016  Session 1 OT Individual Time: 1000-1100 OT Individual Time Calculation (min): 60 min   Session 2 OT Individual Time: 1300-1330 OT Individual Time Calculation (min): 30 min   Patient has met 9 of 9 long term goals due to improved activity tolerance, improved balance and ability to compensate for deficits.  Patient to discharge at overall Modified Independent level.  Patient's care partner is independent to provide the necessary physical assistance at discharge.    Reasons goals not met: N/A   Recommendation:  Patient will benefit from ongoing skilled OT services in home health setting to continue to advance functional skills in the area of BADL and IADL.  Equipment: 20 X 18 manual wheelchair w/ elevating leg rests, RW, reacher  Reasons for discharge: treatment goals met and discharge from hospital  Patient/family agrees with progress made and goals achieved: Yes  Session 1 Pt greeted sitting in w/c and feeling "crumby." Pt given Ensure by RN and drank water, reported slight improvement and willing to participate in Ot. Pt stated he did not feel like he could go on his outting planned for lunch today, and therapy team notified. Pt propelled wc to therapy gym, ambulated 5 feet to mat w/ RW and supervision. LE there ex- knee marches, and knee extension 10x2 sets. Dynamic standing incorporating weight shifting and balance strategies with obstacle course, horse shoes, and basketball goal.   Session 2 Pt completed bathroom transfers and toileting with modified independence. Pt was able to don CAM boot w/ Mod I per OT instruction. OT adjusted w/c and walker, then made pt mod I in the room. Pt left in care of PT at end of session.   OT Discharge Precautions/Restrictions Precautions Precautions: Fall Required Braces or Orthoses: Other  Brace/Splint Other Brace/Splint: CAM boot R LE when OOB Restrictions Weight Bearing Restrictions: Yes RLE Weight Bearing: Non weight bearing Other Position/Activity Restrictions: elevate RLE Pain Pain Assessment Pain Assessment: 0-10 Pain Score: 3  Pain Type: Acute pain Pain Location: Face Pain Orientation: Right Pain Descriptors / Indicators: Aching Pain Onset: On-going Pain Intervention(s): Distraction ADL ADL Grooming: Modified independent Where Assessed-Grooming: Standing at sink Upper Body Bathing: Modified independent Where Assessed-Upper Body Bathing: Wheelchair, Standing at sink, Sitting at sink Lower Body Bathing: Modified independent Where Assessed-Lower Body Bathing: Wheelchair, Standing at sink, Sitting at sink Upper Body Dressing: Modified independent (Device) Where Assessed-Upper Body Dressing: Wheelchair Lower Body Dressing: Modified independent Where Assessed-Lower Body Dressing: Wheelchair Toileting: Modified independent Where Assessed-Toileting: Glass blower/designer: Diplomatic Services operational officer Method: Counselling psychologist: Energy manager: Modified independent Clinical cytogeneticist Method: Optometrist: Radio broadcast assistant ADL Comments: Please see functional navigator Vision/Perception  Vision- Assessment Eye Alignment: Within Functional Limits  Cognition Overall Cognitive Status: Within Functional Limits for tasks assessed Arousal/Alertness: Awake/alert Orientation Level: Oriented X4 Safety/Judgment: Appears intact Sensation Sensation Light Touch: Appears Intact Hot/Cold: Appears Intact Proprioception: Appears Intact Coordination Gross Motor Movements are Fluid and Coordinated: Yes Fine Motor Movements are Fluid and Coordinated: Yes Motor  Motor Motor: Within Functional Limits Balance Balance Balance Assessed: Yes Dynamic Standing Balance Dynamic Standing - Balance Support: During  functional activity Dynamic Standing - Level of Assistance: 6: Modified independent (Device/Increase time) Extremity/Trunk Assessment RUE Assessment RUE Assessment: Within Functional Limits LUE Assessment LUE Assessment: Within Functional Limits   See Function Navigator for Current Functional Status.  Daneen Schick  Christopherjame Carnell 04/08/2016, 4:15 PM

## 2016-04-08 NOTE — Progress Notes (Signed)
Physical Therapy Session Note  Patient Details  Name: Baxter KailMichael D Vandezande MRN: [id number] Date of Birth: 05-Jul-1965  Today's Date: 04/08/2016 PT Individual Time: [id number] AND 1330-1400 PT Individual Time Calculation (min): 15 min AND 30 min   Short Term Goals: Week 1:  PT Short Term Goal 1 (Week 1): = LTGs due to anticipated LOS  Skilled Therapeutic Interventions/Progress Updates:  This therapist was called to the gym by primary therapist reporting that pt was experiencing vertigo during supine <> sit on flat mat.  Primary therapist requesting vestibular screen for BPPV.  Educated pt on vestibular system, purpose and mechanism of injury/causes of BPPV as well as symptoms.  Also verbalized and demonstrated testing for BPPV.  Performed L Hallpike-Dix with L upbeating nystagmus of short duration with pt reporting significant nystagmus; pt agreeable to treatment.  Performed one canalith repositioning maneuver.  Pt noted to be diaphoretic and reporting mild nausea but no vomiting.  Discussed with pt the need for possible re-assessment and second treatment later in the day after the outing; pt agreeable.  Pt left with primary PT to return to room to rest before outing.  PM session: due to pt continuing to feel nauseous and weak after vestibular treatment pt elected to not go on outing in order to rest.  Pt reporting leg rest on w/c too short and causing pressure and pain in RLE; discussed with social worker having longer leg rest delivered.  Pt performed w/c mobility to gym Mod I; reviewed stair negotiation with pt performing up/down 12 stairs with 2 rails and supervision.  Returned to w/c and continued to discuss vestibular findings this morning.  Pt very anxious about experiencing vertigo again and asking questions about etiology of BPPV, re-occurrence and treatment.  Allowed pt to watch educational animated video demonstrating movement of crystals through fluid during testing and treatment.  Following  discussion pt appreciative of information and reports feeling less anxious about D/C.  Pt did not wish to perform reassessment of L posterior canal.  Recommended to pt that if he experiences vertigo again to ask PCP for referral to outpatient vestibular specialist; pt verbalized understanding.  Returned to room and pt left with all items within reach.   Therapy Documentation Precautions:  Precautions Precautions: Fall Required Braces or Orthoses: Other Brace/Splint Other Brace/Splint: CAM boot OOB Restrictions Weight Bearing Restrictions: (P) Yes RLE Weight Bearing: (P) Non weight bearing Other Position/Activity Restrictions: elevate RLE Pain: Pain Assessment Pain Assessment: 0-10 Pain Score: 3  Pain Type: Acute pain Pain Location: Face (eye socket) Pain Orientation: Right Pain Descriptors / Indicators: Aching Pain Onset: On-going Pain Intervention(s): Emotional support Locomotion : Ambulation Ambulation: Yes Ambulation/Gait Assistance: 6: Modified independent (Device/Increase time) Ambulation Distance (Feet): 60 Feet Assistive device: Rolling walker Gait Gait: Yes Gait Pattern: Impaired Gait Pattern: Trunk flexed (hop-to) Gait velocity: decreased Stairs / Additional Locomotion Stairs: Yes Stairs Assistance: 5: Supervision Stair Management Technique: Two rails;Step to pattern;Forwards Number of Stairs: 12 Height of Stairs: 6 Ramp: 6: Modified independent Surveyor, minerals(Device) Corporate treasurerWheelchair Mobility Wheelchair Mobility: Yes Wheelchair Assistance: 6: Modified independent (Device/Increase time) Occupational hygienistWheelchair Propulsion: Both upper extremities Wheelchair Parts Management: Independent Distance: >150 ft   See Function Navigator for Current Functional Status.   Therapy/Group: Individual Therapy  [name], Marjarie Irion [facility]auc[facility] 04/08/2016, 8:54 AM
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
## 2016-04-08 NOTE — Progress Notes (Signed)
Physical Therapy Discharge Summary  Patient Details  Name: Gregory Jordan MRN: [id number] Date of Birth: 1965/07/28  Today's Date: 04/08/2016 PT Individual Time: 0017-4944 PT Individual Time Calculation (min): 45 min   Patient has met 7 of 7 long term goals due to improved activity tolerance, improved balance, improved postural control, increased strength, decreased pain and ability to compensate for deficits.  Patient to discharge at a household ambulatory level Modified Independent. Patient's care partner is independent to provide the necessary physical assistance at discharge.  Reasons goals not met: NA  Recommendation:  Patient will benefit from ongoing skilled PT services in home health setting to continue to advance safe functional mobility, address ongoing impairments in strength, standing balance, activity tolerance, and minimize fall risk.  Equipment: 20 x 18 manual wheelchair with elevating leg rests, RW  Reasons for discharge: treatment goals met and discharge from hospital  Patient/family agrees with progress made and goals achieved: Yes  Skilled Therapeutic Intervention Patient in bed upon arrival, performed UB/LB dressing EOB with setup assist. Patient mod I with use of RW for transfers, ambulation x 60 ft and up/down ramp and across mulched surface, simulated car transfer to sedan height, and bed mobility on flat surface. With sit <> supine, patient c/o dizziness consistent with BPPV symptoms, vestibular PT notified and present to screen and treat patient. After treatment, patient diaphoretic and nauseated and c/o feeling "wiped out," returned to room in wheelchair and deferred stair negotiation for later session. Patient verbalized understanding of HEP handout. Patient left sitting in wheelchair with all needs within reach and no further questions/concerns regarding discharge to mother's house planned tomorrow.   PT Discharge Precautions/RestrictionsPrecautions Required  Braces or Orthoses: Other Brace/Splint Other Brace/Splint: CAM boot OOB Restrictions Weight Bearing Restrictions: Yes RLE Weight Bearing: Non weight bearing Other Position/Activity Restrictions: elevate RLE  Pain  3/10 eye socket pain, provided emotional support Vision/Perception   Defer to OT discharge   Cognition Overall Cognitive Status: Within Functional Limits for tasks assessed Sensation Sensation Light Touch: Appears Intact Hot/Cold: Appears Intact Proprioception: Appears Intact Coordination Gross Motor Movements are Fluid and Coordinated: Yes Fine Motor Movements are Fluid and Coordinated: Yes Motor  Motor Motor: Within Functional Limits  Mobility Bed Mobility Bed Mobility: Supine to Sit;Sit to Supine;Rolling Right;Rolling Left Rolling Right: 6: Modified independent (Device/Increase time) Rolling Left: 6: Modified independent (Device/Increase time) Supine to Sit: 6: Modified independent (Device/Increase time) Sit to Supine: 6: Modified independent (Device/Increase time) Locomotion  Ambulation Ambulation: Yes Ambulation/Gait Assistance: 6: Modified independent (Device/Increase time) Ambulation Distance (Feet): 60 Feet Assistive device: Rolling walker Gait Gait: Yes Gait Pattern: Impaired Gait Pattern: Trunk flexed (hop-to) Gait velocity: decreased Stairs / Additional Locomotion Ramp: 6: Modified independent Water quality scientist) Architect: Yes Wheelchair Assistance: 6: Modified independent (Device/Increase time) Environmental health practitioner: Both upper extremities Wheelchair Parts Management: Independent Distance: >150 ft  Trunk/Postural Assessment  Cervical Assessment Cervical Assessment: Within Functional Limits Thoracic Assessment Thoracic Assessment: Within Functional Limits Lumbar Assessment Lumbar Assessment: Within Functional Limits Postural Control Postural Control: Within Functional Limits  Balance Dynamic Standing Balance Dynamic  Standing - Balance Support: During functional activity Dynamic Standing - Level of Assistance: 6: Modified independent (Device/Increase time) Extremity Assessment  RUE Assessment RUE Assessment: Within Functional Limits LUE Assessment LUE Assessment: Within Functional Limits RLE Assessment RLE Assessment: Exceptions to [facility] RLE Strength RLE Overall Strength: Deficits;Due to precautions RLE Overall Strength Comments: Hip flexion and knee flexion/extension WFL, UTA ankle DF/PF LLE Assessment LLE Assessment: Within Functional Limits  See Function Navigator for Current Functional Status.  Carney Living A 04/08/2016, 7:59 AM

## 2016-04-09 ENCOUNTER — Inpatient Hospital Stay (HOSPITAL_COMMUNITY): Payer: 59 | Admitting: Occupational Therapy

## 2016-04-09 ENCOUNTER — Inpatient Hospital Stay (HOSPITAL_COMMUNITY): Payer: 59 | Admitting: Physical Therapy

## 2016-04-09 ENCOUNTER — Encounter (HOSPITAL_COMMUNITY): Payer: Self-pay | Admitting: Emergency Medicine

## 2016-04-09 MED ORDER — POLYETHYLENE GLYCOL 3350 17 G PO PACK: 17.0000 g | PACK | Freq: Every day | ORAL | 0 refills | Status: DC

## 2016-04-09 MED ORDER — OXYCODONE HCL 10 MG PO TABS: 10.0000 mg | ORAL_TABLET | Freq: Four times a day (QID) | ORAL | 0 refills | Status: DC | PRN

## 2016-04-09 MED ORDER — POLYSACCHARIDE IRON COMPLEX 150 MG PO CAPS: 150.0000 mg | ORAL_CAPSULE | Freq: Two times a day (BID) | ORAL | 0 refills | Status: DC

## 2016-04-09 MED ORDER — PANTOPRAZOLE SODIUM 40 MG PO TBEC
40.0000 mg | DELAYED_RELEASE_TABLET | Freq: Every day | ORAL | 0 refills | Status: DC
Start: 2016-04-10 — End: 2023-03-24

## 2016-04-09 MED ORDER — METHOCARBAMOL 500 MG PO TABS: 500.0000 mg | ORAL_TABLET | Freq: Four times a day (QID) | ORAL | 0 refills | Status: DC | PRN

## 2016-04-09 MED ORDER — TOBRAMYCIN-DEXAMETHASONE 0.3-0.1 % OP OINT
TOPICAL_OINTMENT | Freq: Two times a day (BID) | OPHTHALMIC | 0 refills | Status: DC
Start: 2016-04-09 — End: 2023-03-24

## 2016-04-09 MED ORDER — DOCUSATE SODIUM 100 MG PO CAPS: 200.0000 mg | ORAL_CAPSULE | Freq: Two times a day (BID) | ORAL | 0 refills | Status: DC

## 2016-04-09 NOTE — Progress Notes (Signed)
Social Work  Discharge Note  The overall goal for the admission was met for:   Discharge location: Yes-HOME TO [place].  Length of Stay: Yes-8 DAYS  Discharge activity level: Yes-MOD/I LEVEL  Home/community participation: Yes  Services provided included: MD, RD, PT, OT, RN, CM, TR, Pharmacy and SW  Financial Services: Private Insurance: [employer]  Follow-up services arranged: Home Health: [facility]-PT,OT,RN, DME: [place] and Patient/Family has no preference for HH/DME agencies  Comments (or additional information):PT DID WELL AND REACHED MOD/I LEVEL. GOING TO MOM'S SO SOMEONE IS THERE. OBTAINED NEW PCP FOR PT [place] 12/8 @ 2:15 PM INFORMATION GIVEN TO PT.   Patient/Family verbalized understanding of follow-up arrangements: Yes  Individual responsible for coordination of the follow-up plan: SELF  Confirmed correct DME delivered: Elease Hashimoto 04/09/2016    Elease Hashimoto

## 2016-04-09 NOTE — Plan of Care (Signed)
Problem: RH Leisure Awareness Goal: LTG: Patient will participate in community (TR) LTG: Patient will participate in community reintegration activities to increase ability to identify and adapt to barriers, perform living skills, ambulate/propel wheelchair at specific level  (TR)  Outcome: Not Applicable Date Met: 03/09/12 Goal not met as pt declined scheduled outing due to not feeling well after vestibular eval earlier in the morning.

## 2016-04-09 NOTE — Progress Notes (Signed)
Patient A/O, no noted distress. Patient safely ambulated in the room with proper equipment. Educated patient on wound care. Changed dressing RLE and abd area. Encourage patient when changing dressing at home make sure family commit to proper hand hygiene. Patient slept well throughout the shift. Staff will continue to monitor, meet needs. And maintain safety. Educated patient on medications. Administered prn pain regimen, effective (see flowsheet).

## 2016-04-09 NOTE — Progress Notes (Signed)
Late Entry: Patient discharged home with sister. Discharge instructions completed by Marissa NestlePam Love, Pa. All questions answered. Patient verbalized understanding. PA performed dressing change prior to discharge. Pt escorted off of unit in wheelchair with personal belonging by Geoffery SpruceLeann, NT.

## 2016-04-09 NOTE — Progress Notes (Signed)
Recreational Therapy Discharge Summary Patient Details  Name: FLORENCIO HOLLIBAUGH MRN: [id number] Date of Birth: October 21, 1965 Today's Date: 04/09/2016  Long term goals set: 1  Long term goals met: 0  Comments on progress toward goals: Pt declined scheduled outing due to not feeling well after vestibular eval earlier in the day.  Education provided on community pursuits.  I expect that pt would perform community reintegration skills at supervision, but not observed during this admission.  Pt is scheduled for discharge home today at overall Mod I level.  Reasons for discharge: discharge from hospital  Patient/family agrees with progress made and goals achieved: Yes  Haleemah Buckalew 04/09/2016, 8:52 AM
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
## 2016-04-09 NOTE — Discharge Instructions (Signed)
Inpatient Rehab Discharge Instructions  Gregory Jordan Discharge date and time: 04/09/16   Activities/Precautions/ Functional Status: Activity: no lifting, driving, or strenuous exercise  till cleared by MD Diet: regular diet Wound Care: keep wound clean and dry. Contact MD if you develop any problems with your incision/wound--redness, swelling, increase in pain, drainage or if you develop fever or chills.   Functional status:  ___ No restrictions     ___ Walk up steps independently ___ 24/7 supervision/assistance   ___ Walk up steps with assistance _X__ Intermittent supervision/assistance  ___ Bathe/dress independently ___ Walk with walker     _X__ Bathe/dress with assistance ___ Walk Independently    ___ Shower independently ___ Walk with assistance    ___ Shower with assistance _X__ No alcohol     ___ Return to work/school ________   Special Instructions: 1. Wash right ankle incision with antibiotic soap and water. Pat dry and apply dry compressive dressing. Keep right leg elevated when seated. 2.  No weight on right leg.    COMMUNITY REFERRALS UPON DISCHARGE:    Home Health:   PT, OT, RN  Agency:[facility] [phone]   Date of last service:04/09/2016  Medical Equipment/Items Ordered:WHEELCHAIR & Massie MaroonWIDE ROLLING WALKER   Agency/Supplier:[facility]  [phone]   My questions have been answered and I understand these instructions. I will adhere to these goals and the provided educational materials after my discharge from the hospital.  Patient/Caregiver Signature _______________________________ Date __________  Clinician Signature _______________________________________ Date __________  Please bring this form and your medication list with you to all your follow-up doctor's appointments.

## 2016-04-10 ENCOUNTER — Inpatient Hospital Stay (HOSPITAL_COMMUNITY): Payer: 59 | Admitting: Physical Therapy

## 2016-04-10 ENCOUNTER — Inpatient Hospital Stay (HOSPITAL_COMMUNITY): Payer: 59 | Admitting: Occupational Therapy

## 2016-04-14 ENCOUNTER — Telehealth (INDEPENDENT_AMBULATORY_CARE_PROVIDER_SITE_OTHER): Payer: Self-pay | Admitting: *Deleted

## 2016-04-14 NOTE — Telephone Encounter (Signed)
Home health nurse called to schedule follow up appt, no appts available next week. Call pt back to schedule

## 2016-04-14 NOTE — Telephone Encounter (Signed)
Can you please make patient appt for Friday afternoon? He is status post ORIF Right Fibula and Pilon on 03/27/2016. This will be his first post op with us. He has been in rehab. Thanks so much.

## 2016-04-15 ENCOUNTER — Telehealth: Payer: Self-pay

## 2016-04-15 ENCOUNTER — Telehealth (HOSPITAL_COMMUNITY): Payer: Self-pay

## 2016-04-15 NOTE — Telephone Encounter (Signed)
[facility] and informed us that they saw the patient and are sending orders for the doctor to sign for the patient.

## 2016-04-15 NOTE — Telephone Encounter (Signed)
Rescheudled appt for 12/13 at 2:30, LM with [facility] rep to that effect.

## 2016-04-15 NOTE — Telephone Encounter (Signed)
Spoke with patient and needed appointment in the am, shc'd appointment for 04/22/16 @ 8:45am.

## 2016-04-16 ENCOUNTER — Telehealth (HOSPITAL_COMMUNITY): Payer: Self-pay
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
## 2016-04-17 ENCOUNTER — Ambulatory Visit: Payer: 59 | Admitting: Primary Care

## 2016-04-17 ENCOUNTER — Telehealth: Payer: Self-pay

## 2016-04-17 NOTE — Telephone Encounter (Signed)
D[facility] called stating the patient has refused an Eval from her stating that he is independent to do tasks and dosen't need the eval.  Please advise.

## 2016-04-21 ENCOUNTER — Telehealth (INDEPENDENT_AMBULATORY_CARE_PROVIDER_SITE_OTHER): Payer: Self-pay | Admitting: Orthopaedic Surgery

## 2016-04-21 NOTE — Telephone Encounter (Signed)
Calling re the incisions on the R outer ankle...Marland Kitchen.open .5x.5 Patient is not able to put the wrap back on. Incision was wrapped yesterday, but HC can only once or 2 times per week.  When patient comes tomorrow for OV advise if the area will need to be covered or if he can leave off. Again he is unable to wrap it himself.

## 2016-04-22 ENCOUNTER — Ambulatory Visit (INDEPENDENT_AMBULATORY_CARE_PROVIDER_SITE_OTHER): Payer: 59

## 2016-04-22 ENCOUNTER — Encounter (INDEPENDENT_AMBULATORY_CARE_PROVIDER_SITE_OTHER): Payer: Self-pay | Admitting: Orthopaedic Surgery

## 2016-04-22 ENCOUNTER — Ambulatory Visit (INDEPENDENT_AMBULATORY_CARE_PROVIDER_SITE_OTHER): Payer: 59 | Admitting: Orthopaedic Surgery

## 2016-04-22 VITALS — BP 128/94 | HR 111 | Ht 69.0 in | Wt 265.0 lb

## 2016-04-22 DIAGNOSIS — M25571 Pain in right ankle and joints of right foot: Secondary | ICD-10-CM

## 2016-04-22 DIAGNOSIS — S82871E Displaced pilon fracture of right tibia, subsequent encounter for open fracture type I or II with routine healing: Secondary | ICD-10-CM

## 2016-04-22 MED ORDER — OXYCODONE-ACETAMINOPHEN 5-325 MG PO TABS: 1.0000 | ORAL_TABLET | ORAL | 0 refills | Status: DC | PRN

## 2016-04-22 NOTE — Telephone Encounter (Signed)
Patient was seen in office today for appointment
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
## 2016-04-23 ENCOUNTER — Telehealth (INDEPENDENT_AMBULATORY_CARE_PROVIDER_SITE_OTHER): Payer: Self-pay | Admitting: Orthopaedic Surgery

## 2016-04-23 NOTE — Telephone Encounter (Signed)
Please see message below and advise . Thanks

## 2016-04-23 NOTE — Telephone Encounter (Signed)
[facility] calling for verification for discharge of home care. She stated pt said he didn't need to come anymore and she does not know whether to d/c or not. Her name is SimontonBeth, [phone]

## 2016-04-23 NOTE — Telephone Encounter (Signed)
I called left her a message.

## 2016-04-24 ENCOUNTER — Telehealth: Payer: Self-pay

## 2016-04-24 NOTE — Telephone Encounter (Signed)
Gregory Jordan with [facility] has called to let Dr. Allena KatzPatel know that Gregory NeedleMichael has been d/c from home care. Patient declined visits at this time. [facility] "Dr. Ophelia CharterYates said he doesn't need home care at this time because he can not bear weight". If there are any questions or concerns  Gregory SandyBeth can be reached at [phone].

## 2016-04-28 ENCOUNTER — Encounter (INDEPENDENT_AMBULATORY_CARE_PROVIDER_SITE_OTHER): Payer: Self-pay

## 2016-04-28 ENCOUNTER — Ambulatory Visit (INDEPENDENT_AMBULATORY_CARE_PROVIDER_SITE_OTHER): Payer: 59 | Admitting: Orthopaedic Surgery

## 2016-04-28 ENCOUNTER — Encounter (INDEPENDENT_AMBULATORY_CARE_PROVIDER_SITE_OTHER): Payer: Self-pay | Admitting: Orthopaedic Surgery

## 2016-04-28 VITALS — BP 127/88 | HR 84 | Ht 69.0 in | Wt 265.0 lb

## 2016-04-28 DIAGNOSIS — S82871S Displaced pilon fracture of right tibia, sequela: Secondary | ICD-10-CM
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
## 2016-04-30 ENCOUNTER — Encounter: Payer: 59 | Attending: Physical Medicine & Rehabilitation | Admitting: Physical Medicine & Rehabilitation

## 2016-06-02 ENCOUNTER — Ambulatory Visit (INDEPENDENT_AMBULATORY_CARE_PROVIDER_SITE_OTHER): Payer: 59

## 2016-06-02 ENCOUNTER — Encounter (INDEPENDENT_AMBULATORY_CARE_PROVIDER_SITE_OTHER): Payer: Self-pay

## 2016-06-02 ENCOUNTER — Ambulatory Visit (INDEPENDENT_AMBULATORY_CARE_PROVIDER_SITE_OTHER): Payer: 59 | Admitting: Orthopaedic Surgery

## 2016-06-02 VITALS — BP 137/88 | HR 96 | Ht 69.0 in | Wt 260.0 lb

## 2016-06-02 DIAGNOSIS — S82871S Displaced pilon fracture of right tibia, sequela: Secondary | ICD-10-CM | POA: Diagnosis not present
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
## 2016-07-07 ENCOUNTER — Encounter (INDEPENDENT_AMBULATORY_CARE_PROVIDER_SITE_OTHER): Payer: Self-pay | Admitting: Orthopaedic Surgery

## 2016-07-07 ENCOUNTER — Ambulatory Visit (INDEPENDENT_AMBULATORY_CARE_PROVIDER_SITE_OTHER): Payer: 59 | Admitting: Orthopaedic Surgery

## 2016-07-07 ENCOUNTER — Ambulatory Visit (INDEPENDENT_AMBULATORY_CARE_PROVIDER_SITE_OTHER): Payer: 59

## 2016-07-07 VITALS — BP 142/96 | HR 89 | Ht 69.0 in | Wt 260.0 lb

## 2016-07-07 DIAGNOSIS — S82871S Displaced pilon fracture of right tibia, sequela: Secondary | ICD-10-CM
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
## 2016-08-05 ENCOUNTER — Ambulatory Visit (INDEPENDENT_AMBULATORY_CARE_PROVIDER_SITE_OTHER): Payer: 59 | Admitting: Orthopaedic Surgery

## 2016-09-01 ENCOUNTER — Encounter (INDEPENDENT_AMBULATORY_CARE_PROVIDER_SITE_OTHER): Payer: Self-pay | Admitting: Orthopaedic Surgery

## 2016-09-01 ENCOUNTER — Ambulatory Visit (INDEPENDENT_AMBULATORY_CARE_PROVIDER_SITE_OTHER): Payer: Self-pay

## 2016-09-01 ENCOUNTER — Ambulatory Visit (INDEPENDENT_AMBULATORY_CARE_PROVIDER_SITE_OTHER): Payer: Self-pay | Admitting: Orthopaedic Surgery

## 2016-09-01 VITALS — BP 152/96 | HR 79 | Ht 69.0 in | Wt 260.0 lb

## 2016-09-01 DIAGNOSIS — S82871S Displaced pilon fracture of right tibia, sequela: Secondary | ICD-10-CM
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
## 2023-01-01 ENCOUNTER — Other Ambulatory Visit: Payer: Self-pay

## 2023-01-01 ENCOUNTER — Emergency Department (HOSPITAL_COMMUNITY): Payer: 59

## 2023-01-01 ENCOUNTER — Encounter (HOSPITAL_COMMUNITY): Payer: Self-pay | Admitting: *Deleted

## 2023-01-01 ENCOUNTER — Emergency Department (HOSPITAL_COMMUNITY)
Admission: EM | Admit: 2023-01-01 | Discharge: 2023-01-01 | Disposition: A | Discharge status: Home / Self Care | Payer: 59 | Attending: Emergency Medicine | Admitting: Emergency Medicine

## 2023-01-01 DIAGNOSIS — E279 Disorder of adrenal gland, unspecified: Secondary | ICD-10-CM | POA: Diagnosis not present

## 2023-01-01 DIAGNOSIS — R1084 Generalized abdominal pain: Secondary | ICD-10-CM

## 2023-01-01 DIAGNOSIS — R911 Solitary pulmonary nodule: Secondary | ICD-10-CM | POA: Insufficient documentation

## 2023-01-01 DIAGNOSIS — K439 Ventral hernia without obstruction or gangrene: Secondary | ICD-10-CM | POA: Insufficient documentation

## 2023-01-01 DIAGNOSIS — R109 Unspecified abdominal pain: Secondary | ICD-10-CM | POA: Diagnosis present

## 2023-01-01 LAB — CBC
HCT: 46.9 % (ref 39.0–52.0)
Hemoglobin: 15.7 g/dL (ref 13.0–17.0)
MCH: 31.2 pg (ref 26.0–34.0)
MCHC: 33.5 g/dL (ref 30.0–36.0)
MCV: 93.2 fL (ref 80.0–100.0)
Platelets: 230 10*3/uL (ref 150–400)
RBC: 5.03 MIL/uL (ref 4.22–5.81)
RDW: 13.6 % (ref 11.5–15.5)
WBC: 6 10*3/uL (ref 4.0–10.5)
nRBC: 0 % (ref 0.0–0.2)

## 2023-01-01 LAB — COMPREHENSIVE METABOLIC PANEL
ALT: 32 U/L (ref 0–44)
AST: 22 U/L (ref 15–41)
Albumin: 4.1 g/dL (ref 3.5–5.0)
Alkaline Phosphatase: 57 U/L (ref 38–126)
Anion gap: 14 (ref 5–15)
BUN: 14 mg/dL (ref 6–20)
CO2: 21 mmol/L — ABNORMAL LOW (ref 22–32)
Calcium: 9.4 mg/dL (ref 8.9–10.3)
Chloride: 106 mmol/L (ref 98–111)
Creatinine, Ser: 0.96 mg/dL (ref 0.61–1.24)
GFR, Estimated: >60 mL/min (ref >60)
Glucose, Bld: 122 mg/dL — ABNORMAL HIGH (ref 70–99)
Potassium: 3.9 mmol/L (ref 3.5–5.1)
Sodium: 141 mmol/L (ref 135–145)
Total Bilirubin: 0.6 mg/dL (ref 0.3–1.2)
Total Protein: 7.2 g/dL (ref 6.5–8.1)

## 2023-01-01 LAB — URINALYSIS, ROUTINE W REFLEX MICROSCOPIC
Bilirubin Urine: NEGATIVE
Glucose, UA: NEGATIVE mg/dL
Hgb urine dipstick: NEGATIVE
Ketones, ur: NEGATIVE mg/dL
Leukocytes,Ua: NEGATIVE
Nitrite: NEGATIVE
Protein, ur: NEGATIVE mg/dL
Specific Gravity, Urine: 1.026 (ref 1.005–1.030)
pH: 5 (ref 5.0–8.0)

## 2023-01-01 LAB — LIPASE, BLOOD: Lipase: 38 U/L (ref 11–51)

## 2023-01-01 MED ORDER — LACTATED RINGERS IV BOLUS
500.0000 mL | Freq: Once | INTRAVENOUS | Status: AC
  Administered 2023-01-01: 500 mL via INTRAVENOUS

## 2023-01-01 MED ORDER — IOHEXOL 350 MG/ML SOLN
75.0000 mL | Freq: Once | INTRAVENOUS | Status: AC | PRN
  Administered 2023-01-01: 75 mL via INTRAVENOUS

## 2023-01-01 NOTE — Discharge Instructions (Signed)
You were seen today for abdominal pain On your CAT scan you were noted to have some hernias.  You are given a referral to follow-up with general surgery for this.  If you begin having worsening pain, wiling that you cannot push back in, or vomiting that you cannot control, please return to the emergency department. You had a pulmonary nodule and a left adrenal gland lesion.  Please follow-up with your doctor for further imaging for this. Return to the emergency department if you are worse anytime. Please use Metamucil and or stool softeners so that you are not increasing the pressure in your abdomen.  Consider using an abdominal binder.

## 2023-01-01 NOTE — ED Triage Notes (Signed)
C/o abd. Pain , states he had umbilical hernia of which he has had for some time. C/o n/v.
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
## 2023-03-19 ENCOUNTER — Ambulatory Visit: Payer: Self-pay | Admitting: Surgery

## 2023-03-19 NOTE — Progress Notes (Signed)
Sent message, via epic in basket, requesting orders in epic from surgeon.  

## 2023-03-25 NOTE — Patient Instructions (Signed)
DUE TO COVID-19 ONLY TWO VISITORS  (aged 55 and older)  ARE ALLOWED TO COME WITH YOU AND STAY IN THE WAITING ROOM ONLY DURING PRE OP AND PROCEDURE.   **NO VISITORS ARE ALLOWED IN THE SHORT STAY AREA OR RECOVERY ROOM!!**  IF YOU WILL BE ADMITTED INTO THE HOSPITAL YOU ARE ALLOWED ONLY FOUR SUPPORT PEOPLE DURING VISITATION HOURS ONLY (7 AM -8PM)   The support person(s) must pass our screening, gel in and out, and wear a mask at all times, including in the patient's room. Patients must also wear a mask when staff or their support person are in the room. Visitors GUEST BADGE MUST BE WORN VISIBLY  One adult visitor may remain with you overnight and MUST be in the room by 8 P.M.     Your procedure is scheduled on: 03/30/23   Report to [facility] Main Entrance    Report to admitting at : 5:15 AM   Call this number if you have problems the morning of surgery [phone]   Do not eat food :After Midnight.   After Midnight you may have the following liquids until: 4:30 AM DAY OF SURGERY  Water Black Coffee (sugar ok, NO MILK/CREAM OR CREAMERS)  Tea (sugar ok, NO MILK/CREAM OR CREAMERS) regular and decaf                             Plain Jell-O (NO RED)                                           Fruit ices (not with fruit pulp, NO RED)                                     Popsicles (NO RED)                                                                  Juice: apple, WHITE grape, WHITE cranberry Sports drinks like Gatorade (NO RED)              FOLLOW ANY ADDITIONAL PRE OP INSTRUCTIONS YOU RECEIVED FROM YOUR SURGEON'S OFFICE!!!   Oral Hygiene is also important to reduce your risk of infection.                                    Remember - BRUSH YOUR TEETH THE MORNING OF SURGERY WITH YOUR REGULAR TOOTHPASTE  DENTURES WILL BE REMOVED PRIOR TO SURGERY PLEASE DO NOT APPLY "Poly grip" OR ADHESIVES!!!   Do NOT smoke after Midnight   Take these medicines the morning of surgery with A  SIP OF WATER: NONE.                              You may not have any metal on your body including hair pins, jewelry, and body piercing  Do not wear lotions, powders, perfumes/cologne, or deodorant              Men may shave face and neck.   Do not bring valuables to the hospital. [facility] IS NOT             RESPONSIBLE   FOR VALUABLES.   Contacts, glasses, or bridgework may not be worn into surgery.   Bring small overnight bag day of surgery.   DO NOT BRING YOUR HOME MEDICATIONS TO THE HOSPITAL. PHARMACY WILL DISPENSE MEDICATIONS LISTED ON YOUR MEDICATION LIST TO YOU DURING YOUR ADMISSION IN THE HOSPITAL!    Patients discharged on the day of surgery will not be allowed to drive home.  Someone NEEDS to stay with you for the first 24 hours after anesthesia.   Special Instructions: Bring a copy of your healthcare power of attorney and living will documents         the day of surgery if you haven't scanned them before.              Please read over the following fact sheets you were given: IF YOU HAVE QUESTIONS ABOUT YOUR PRE-OP INSTRUCTIONS PLEASE CALL [phone]    [facility] - Preparing for Surgery Before surgery, you can play an important role.  Because skin is not sterile, your skin needs to be as free of germs as possible.  You can reduce the number of germs on your skin by washing with CHG (chlorahexidine gluconate) soap before surgery.  CHG is an antiseptic cleaner which kills germs and bonds with the skin to continue killing germs even after washing. Please DO NOT use if you have an allergy to CHG or antibacterial soaps.  If your skin becomes reddened/irritated stop using the CHG and inform your nurse when you arrive at Short Stay. Do not shave (including legs and underarms) for at least 48 hours prior to the first CHG shower.  You may shave your face/neck. Please follow these instructions carefully:  1.  Shower with CHG Soap the night before surgery and the   morning of Surgery.  2.  If you choose to wash your hair, wash your hair first as usual with your  normal  shampoo.  3.  After you shampoo, rinse your hair and body thoroughly to remove the  shampoo.                           4.  Use CHG as you would any other liquid soap.  You can apply chg directly  to the skin and wash                       Gently with a scrungie or clean washcloth.  5.  Apply the CHG Soap to your body ONLY FROM THE NECK DOWN.   Do not use on face/ open                           Wound or open sores. Avoid contact with eyes, ears mouth and genitals (private parts).                       Wash face,  Genitals (private parts) with your normal soap.             6.  Wash thoroughly, paying special attention to the area where your surgery  will be performed.  7.  Thoroughly rinse your body with warm water from the neck down.  8.  DO NOT shower/wash with your normal soap after using and rinsing off  the CHG Soap.                9.  Pat yourself dry with a clean towel.            10.  Wear clean pajamas.            11.  Place clean sheets on your bed the night of your first shower and do not  sleep with pets. Day of Surgery : Do not apply any lotions/deodorants the morning of surgery.  Please wear clean clothes to the hospital/surgery center.  FAILURE TO FOLLOW THESE INSTRUCTIONS MAY RESULT IN THE CANCELLATION OF YOUR SURGERY PATIENT SIGNATURE_________________________________  NURSE SIGNATURE__________________________________  ________________________________________________________________________

## 2023-03-26 ENCOUNTER — Encounter (HOSPITAL_COMMUNITY)
Admission: RE | Admit: 2023-03-26 | Discharge: 2023-03-26 | Disposition: A | Discharge status: Home / Self Care | Payer: 59 | Source: Ambulatory Visit | Attending: Surgery | Admitting: Surgery

## 2023-03-26 ENCOUNTER — Other Ambulatory Visit: Payer: Self-pay

## 2023-03-26 ENCOUNTER — Encounter (HOSPITAL_COMMUNITY): Payer: Self-pay

## 2023-03-26 VITALS — BP 145/92 | HR 74 | Temp 98.2°F | Ht 68.0 in | Wt 242.0 lb

## 2023-03-26 DIAGNOSIS — Z01812 Encounter for preprocedural laboratory examination: Secondary | ICD-10-CM | POA: Diagnosis not present

## 2023-03-26 DIAGNOSIS — Z01818 Encounter for other preprocedural examination: Secondary | ICD-10-CM | POA: Diagnosis present

## 2023-03-26 HISTORY — DX: Pneumonia, unspecified organism: J18.9

## 2023-03-26 LAB — CBC
HCT: 47.1 % (ref 39.0–52.0)
Hemoglobin: 15.2 g/dL (ref 13.0–17.0)
MCH: 30.5 pg (ref 26.0–34.0)
MCHC: 32.3 g/dL (ref 30.0–36.0)
MCV: 94.4 fL (ref 80.0–100.0)
Platelets: 257 10*3/uL (ref 150–400)
RBC: 4.99 MIL/uL (ref 4.22–5.81)
RDW: 13.6 % (ref 11.5–15.5)
WBC: 9 10*3/uL (ref 4.0–10.5)
nRBC: 0 % (ref 0.0–0.2)

## 2023-03-26 NOTE — Progress Notes (Addendum)
For Anesthesia: PCP - NO PCP Cardiologist - N/A  Bowel Prep reminder:  Chest x-ray -  EKG -  Stress Test -  ECHO -  Cardiac Cath -  Pacemaker/ICD device last checked: Pacemaker orders received: Device Rep notified:  Spinal Cord Stimulator: N/A  Sleep Study - N/A CPAP -   Fasting Blood Sugar - N/A Checks Blood Sugar _____ times a day Date and result of last Hgb A1c- 5.9: 12/27/22  Last dose of GLP1 agonist- N/A GLP1 instructions:   Last dose of SGLT-2 inhibitors- N/A SGLT-2 instructions:   Blood Thinner Instructions: N/A Aspirin Instructions: Last Dose:  Activity level: Can go up a flight of stairs and activities of daily living without stopping and without chest pain and/or shortness of breath   Able to exercise without chest pain and/or shortness of breath  Anesthesia review:   Patient denies shortness of breath, fever, cough and chest pain at PAT appointment   Patient verbalized understanding of instructions that were given to them at the PAT appointment. Patient was also instructed that they will need to review over the PAT instructions again at home before surgery.

## 2023-03-29 NOTE — Anesthesia Preprocedure Evaluation (Signed)
Anesthesia Evaluation  Patient identified by MRN, date of birth, ID band Patient awake    Reviewed: Allergy & Precautions, H&P , NPO status , Patient's Chart, lab work & pertinent test results  Airway Mallampati: [name]  TM Distance: >3 FB Neck ROM: Full    Dental no notable dental hx. (+) Teeth Intact, Dental Advisory Given   Pulmonary neg pulmonary ROS   Pulmonary exam normal breath sounds clear to auscultation       Cardiovascular Exercise Tolerance: Good negative cardio ROS  Rhythm:Regular Rate:Normal     Neuro/Psych negative neurological ROS  negative psych ROS   GI/Hepatic negative GI ROS, Neg liver ROS,,,  Endo/Other    Class 3 obesity  Renal/GU negative Renal ROS  negative genitourinary   Musculoskeletal   Abdominal   Peds  Hematology  (+) Blood dyscrasia, anemia   Anesthesia Other Findings   Reproductive/Obstetrics negative OB ROS                             Anesthesia Physical Anesthesia Plan  ASA: 2  Anesthesia Plan: General   Post-op Pain Management: Tylenol PO (pre-op)* and Lidocaine infusion*   Induction: Intravenous  PONV Risk Score and Plan: 3 and Ondansetron, Dexamethasone and Midazolam  Airway Management Planned: Oral ETT  Additional Equipment:   Intra-op Plan:   Post-operative Plan: Extubation in OR  Informed Consent: I have reviewed the patients History and Physical, chart, labs and discussed the procedure including the risks, benefits and alternatives for the proposed anesthesia with the patient or authorized representative who has indicated his/her understanding and acceptance.     Dental advisory given  Plan Discussed with: CRNA  Anesthesia Plan Comments:        Anesthesia Quick Evaluation

## 2023-03-30 ENCOUNTER — Other Ambulatory Visit: Payer: Self-pay

## 2023-03-30 ENCOUNTER — Other Ambulatory Visit (HOSPITAL_COMMUNITY): Payer: Self-pay

## 2023-03-30 ENCOUNTER — Ambulatory Visit (HOSPITAL_COMMUNITY): Payer: 59 | Admitting: Anesthesiology

## 2023-03-30 ENCOUNTER — Encounter (HOSPITAL_COMMUNITY)
Admission: AD | Disposition: A | Discharge status: Home / Self Care | Payer: Self-pay | Source: Home / Self Care | Attending: Surgery

## 2023-03-30 ENCOUNTER — Encounter (HOSPITAL_COMMUNITY): Payer: Self-pay | Admitting: Surgery

## 2023-03-30 ENCOUNTER — Ambulatory Visit (HOSPITAL_COMMUNITY): Payer: 59

## 2023-03-30 ENCOUNTER — Ambulatory Visit (HOSPITAL_BASED_OUTPATIENT_CLINIC_OR_DEPARTMENT_OTHER): Payer: 59 | Admitting: Anesthesiology

## 2023-03-30 ENCOUNTER — Inpatient Hospital Stay (HOSPITAL_COMMUNITY)
Admission: AD | Admit: 2023-03-30 | Discharge: 2023-04-03 | DRG: 351 | Disposition: A | Discharge status: Home / Self Care | Payer: 59 | Attending: Surgery | Admitting: Surgery

## 2023-03-30 DIAGNOSIS — K439 Ventral hernia without obstruction or gangrene: Principal | ICD-10-CM | POA: Diagnosis present

## 2023-03-30 DIAGNOSIS — K4090 Unilateral inguinal hernia, without obstruction or gangrene, not specified as recurrent: Secondary | ICD-10-CM | POA: Diagnosis not present

## 2023-03-30 DIAGNOSIS — D62 Acute posthemorrhagic anemia: Secondary | ICD-10-CM | POA: Diagnosis not present

## 2023-03-30 DIAGNOSIS — K4020 Bilateral inguinal hernia, without obstruction or gangrene, not specified as recurrent: Secondary | ICD-10-CM | POA: Diagnosis present

## 2023-03-30 DIAGNOSIS — K432 Incisional hernia without obstruction or gangrene: Secondary | ICD-10-CM

## 2023-03-30 DIAGNOSIS — Z6836 Body mass index (BMI) 36.0-36.9, adult: Secondary | ICD-10-CM

## 2023-03-30 DIAGNOSIS — R000 Tachycardia, unspecified: Secondary | ICD-10-CM | POA: Diagnosis present

## 2023-03-30 DIAGNOSIS — E66813 Obesity, class 3: Secondary | ICD-10-CM | POA: Diagnosis present

## 2023-03-30 DIAGNOSIS — Z79899 Other long term (current) drug therapy: Secondary | ICD-10-CM

## 2023-03-30 HISTORY — PX: XI ROBOTIC ASSISTED VENTRAL HERNIA: SHX6789

## 2023-03-30 LAB — CBC
HCT: 39.8 % (ref 39.0–52.0)
Hemoglobin: 12.7 g/dL — ABNORMAL LOW (ref 13.0–17.0)
MCH: 31.1 pg (ref 26.0–34.0)
MCHC: 31.9 g/dL (ref 30.0–36.0)
MCV: 97.5 fL (ref 80.0–100.0)
Platelets: 293 10*3/uL (ref 150–400)
RBC: 4.08 MIL/uL — ABNORMAL LOW (ref 4.22–5.81)
RDW: 13.7 % (ref 11.5–15.5)
WBC: 20 10*3/uL — ABNORMAL HIGH (ref 4.0–10.5)
nRBC: 0 % (ref 0.0–0.2)

## 2023-03-30 LAB — GLUCOSE, CAPILLARY: Glucose-Capillary: 218 mg/dL — ABNORMAL HIGH (ref 70–99)

## 2023-03-30 LAB — CREATININE, SERUM
Creatinine, Ser: 0.8 mg/dL (ref 0.61–1.24)
GFR, Estimated: >60 mL/min (ref >60)

## 2023-03-30 SURGERY — REPAIR, HERNIA, VENTRAL, ROBOT-ASSISTED
Anesthesia: General

## 2023-03-30 MED ORDER — BUPIVACAINE LIPOSOME 1.3 % IJ SUSP
INTRAMUSCULAR | Status: AC
  Filled 2023-03-30: qty 20

## 2023-03-30 MED ORDER — KETOROLAC TROMETHAMINE 15 MG/ML IJ SOLN
15.0000 mg | Freq: Three times a day (TID) | INTRAMUSCULAR | Status: DC
  Administered 2023-03-30 – 2023-04-03 (×11): 15 mg via INTRAVENOUS
  Filled 2023-03-30 (×11): qty 1

## 2023-03-30 MED ORDER — ROCURONIUM BROMIDE 10 MG/ML (PF) SYRINGE
PREFILLED_SYRINGE | INTRAVENOUS | Status: AC
  Filled 2023-03-30: qty 10

## 2023-03-30 MED ORDER — FENTANYL CITRATE (PF) 100 MCG/2ML IJ SOLN
INTRAMUSCULAR | Status: AC
  Filled 2023-03-30: qty 2

## 2023-03-30 MED ORDER — BUPIVACAINE LIPOSOME 1.3 % IJ SUSP: 20.0000 mL | Freq: Once | INTRAMUSCULAR | Status: DC

## 2023-03-30 MED ORDER — CEFAZOLIN SODIUM-DEXTROSE 2-4 GM/100ML-% IV SOLN
2.0000 g | INTRAVENOUS | Status: AC
  Administered 2023-03-30: 2 g via INTRAVENOUS
  Filled 2023-03-30: qty 100

## 2023-03-30 MED ORDER — ONDANSETRON HCL 4 MG/2ML IJ SOLN
INTRAMUSCULAR | Status: AC
  Filled 2023-03-30: qty 2

## 2023-03-30 MED ORDER — METHOCARBAMOL 1000 MG/10ML IJ SOLN: 500.0000 mg | Freq: Four times a day (QID) | INTRAMUSCULAR | Status: DC | PRN

## 2023-03-30 MED ORDER — ENOXAPARIN SODIUM 40 MG/0.4ML IJ SOSY
40.0000 mg | PREFILLED_SYRINGE | INTRAMUSCULAR | Status: DC
Start: 2023-03-31 — End: 2023-04-01
  Administered 2023-03-31: 40 mg via SUBCUTANEOUS
  Filled 2023-03-30 (×2): qty 0.4

## 2023-03-30 MED ORDER — ACETAMINOPHEN 500 MG PO TABS
1000.0000 mg | ORAL_TABLET | ORAL | Status: AC
  Administered 2023-03-30: 1000 mg via ORAL
  Filled 2023-03-30: qty 2

## 2023-03-30 MED ORDER — CHLORHEXIDINE GLUCONATE CLOTH 2 % EX PADS: 6.0000 | MEDICATED_PAD | Freq: Once | CUTANEOUS | Status: DC

## 2023-03-30 MED ORDER — HYDROMORPHONE HCL 1 MG/ML IJ SOLN
INTRAMUSCULAR | Status: AC
  Filled 2023-03-30: qty 1

## 2023-03-30 MED ORDER — 0.9 % SODIUM CHLORIDE (POUR BTL) OPTIME
TOPICAL | Status: DC | PRN
  Administered 2023-03-30: 1000 mL

## 2023-03-30 MED ORDER — PROPOFOL 10 MG/ML IV BOLUS
INTRAVENOUS | Status: DC | PRN
  Administered 2023-03-30: 200 mg via INTRAVENOUS

## 2023-03-30 MED ORDER — ONDANSETRON HCL 4 MG/2ML IJ SOLN
INTRAMUSCULAR | Status: AC
  Administered 2023-03-30: 4 mg via INTRAVENOUS
  Filled 2023-03-30: qty 2

## 2023-03-30 MED ORDER — KETAMINE HCL 50 MG/5ML IJ SOSY
PREFILLED_SYRINGE | INTRAMUSCULAR | Status: AC
  Filled 2023-03-30: qty 5

## 2023-03-30 MED ORDER — METHOCARBAMOL 750 MG PO TABS
750.0000 mg | ORAL_TABLET | Freq: Four times a day (QID) | ORAL | 0 refills | Status: AC | PRN
  Filled 2023-03-30: qty 30, 8d supply, fill #0

## 2023-03-30 MED ORDER — LACTATED RINGERS IV BOLUS: 1000.0000 mL | Freq: Once | INTRAVENOUS | Status: DC

## 2023-03-30 MED ORDER — ALBUMIN HUMAN 5 % IV SOLN
INTRAVENOUS | Status: AC
  Administered 2023-03-30: 12.5 g via INTRAVENOUS
  Filled 2023-03-30: qty 250

## 2023-03-30 MED ORDER — BUPIVACAINE-EPINEPHRINE 0.25% -1:200000 IJ SOLN
INTRAMUSCULAR | Status: DC | PRN
  Administered 2023-03-30: 30 mL

## 2023-03-30 MED ORDER — OXYCODONE HCL 5 MG PO TABS
10.0000 mg | ORAL_TABLET | ORAL | Status: DC | PRN
  Administered 2023-03-31 – 2023-04-02 (×5): 10 mg via ORAL
  Filled 2023-03-30 (×5): qty 2

## 2023-03-30 MED ORDER — SUGAMMADEX SODIUM 200 MG/2ML IV SOLN
INTRAVENOUS | Status: DC | PRN
  Administered 2023-03-30: 400 mg via INTRAVENOUS

## 2023-03-30 MED ORDER — OXYCODONE-ACETAMINOPHEN 5-325 MG PO TABS
1.0000 | ORAL_TABLET | ORAL | 0 refills | Status: AC | PRN
  Filled 2023-03-30: qty 20, 4d supply, fill #0

## 2023-03-30 MED ORDER — DEXAMETHASONE SODIUM PHOSPHATE 10 MG/ML IJ SOLN
INTRAMUSCULAR | Status: AC
  Filled 2023-03-30: qty 1

## 2023-03-30 MED ORDER — LACTATED RINGERS IV SOLN: INTRAVENOUS | Status: DC

## 2023-03-30 MED ORDER — ROCURONIUM BROMIDE 10 MG/ML (PF) SYRINGE
PREFILLED_SYRINGE | INTRAVENOUS | Status: DC | PRN
  Administered 2023-03-30: 100 mg via INTRAVENOUS
  Administered 2023-03-30: 20 mg via INTRAVENOUS
  Administered 2023-03-30: 30 mg via INTRAVENOUS
  Administered 2023-03-30: 20 mg via INTRAVENOUS

## 2023-03-30 MED ORDER — ALBUMIN HUMAN 5 % IV SOLN: 12.5000 g | Freq: Once | INTRAVENOUS | Status: AC

## 2023-03-30 MED ORDER — ORAL CARE MOUTH RINSE: 15.0000 mL | Freq: Once | OROMUCOSAL | Status: AC

## 2023-03-30 MED ORDER — PROPOFOL 10 MG/ML IV BOLUS
INTRAVENOUS | Status: AC
  Filled 2023-03-30: qty 20

## 2023-03-30 MED ORDER — ACETAMINOPHEN 325 MG PO TABS
650.0000 mg | ORAL_TABLET | Freq: Four times a day (QID) | ORAL | Status: DC
  Administered 2023-03-30 – 2023-04-03 (×14): 650 mg via ORAL
  Filled 2023-03-30 (×15): qty 2

## 2023-03-30 MED ORDER — SIMETHICONE 80 MG PO CHEW: 80.0000 mg | CHEWABLE_TABLET | Freq: Four times a day (QID) | ORAL | Status: DC | PRN

## 2023-03-30 MED ORDER — DEXAMETHASONE SODIUM PHOSPHATE 10 MG/ML IJ SOLN
INTRAMUSCULAR | Status: DC | PRN
  Administered 2023-03-30: 10 mg via INTRAVENOUS

## 2023-03-30 MED ORDER — HYDROMORPHONE HCL 1 MG/ML IJ SOLN
1.0000 mg | INTRAMUSCULAR | Status: DC | PRN
  Administered 2023-04-01 (×3): 1 mg via INTRAVENOUS
  Filled 2023-03-30 (×3): qty 1

## 2023-03-30 MED ORDER — BUPIVACAINE-EPINEPHRINE 0.25% -1:200000 IJ SOLN
INTRAMUSCULAR | Status: AC
  Filled 2023-03-30: qty 1

## 2023-03-30 MED ORDER — ONDANSETRON HCL 4 MG/2ML IJ SOLN
INTRAMUSCULAR | Status: DC | PRN
  Administered 2023-03-30: 4 mg via INTRAVENOUS

## 2023-03-30 MED ORDER — ONDANSETRON HCL 4 MG/2ML IJ SOLN: 4.0000 mg | Freq: Four times a day (QID) | INTRAMUSCULAR | Status: DC | PRN

## 2023-03-30 MED ORDER — MIDAZOLAM HCL 2 MG/2ML IJ SOLN
INTRAMUSCULAR | Status: AC
Start: 2023-03-30 — End: ?
  Filled 2023-03-30: qty 2

## 2023-03-30 MED ORDER — ONDANSETRON HCL 4 MG/2ML IJ SOLN: 4.0000 mg | Freq: Once | INTRAMUSCULAR | Status: AC

## 2023-03-30 MED ORDER — HYDROMORPHONE HCL 1 MG/ML IJ SOLN
0.2500 mg | INTRAMUSCULAR | Status: DC | PRN
  Administered 2023-03-30 (×2): 0.5 mg via INTRAVENOUS

## 2023-03-30 MED ORDER — OXYCODONE HCL 5 MG PO TABS
5.0000 mg | ORAL_TABLET | ORAL | Status: DC | PRN
  Administered 2023-04-01: 5 mg via ORAL
  Filled 2023-03-30 (×3): qty 1

## 2023-03-30 MED ORDER — ROCURONIUM BROMIDE 10 MG/ML (PF) SYRINGE
PREFILLED_SYRINGE | INTRAVENOUS | Status: AC
Start: 2023-03-30 — End: ?
  Filled 2023-03-30: qty 10

## 2023-03-30 MED ORDER — CHLORHEXIDINE GLUCONATE 0.12 % MT SOLN
15.0000 mL | Freq: Once | OROMUCOSAL | Status: AC
  Administered 2023-03-30: 15 mL via OROMUCOSAL

## 2023-03-30 MED ORDER — LACTATED RINGERS IV BOLUS
1000.0000 mL | Freq: Once | INTRAVENOUS | Status: AC
  Administered 2023-03-30: 1000 mL via INTRAVENOUS

## 2023-03-30 MED ORDER — ENOXAPARIN SODIUM 40 MG/0.4ML IJ SOSY
40.0000 mg | PREFILLED_SYRINGE | Freq: Once | INTRAMUSCULAR | Status: DC
  Filled 2023-03-30: qty 0.4

## 2023-03-30 MED ORDER — ALBUMIN HUMAN 5 % IV SOLN: 12.5000 g | Freq: Once | INTRAVENOUS | Status: DC

## 2023-03-30 MED ORDER — CELECOXIB 200 MG PO CAPS
400.0000 mg | ORAL_CAPSULE | ORAL | Status: AC
  Administered 2023-03-30: 400 mg via ORAL
  Filled 2023-03-30: qty 2

## 2023-03-30 MED ORDER — HYDROMORPHONE HCL 1 MG/ML IJ SOLN
INTRAMUSCULAR | Status: AC
  Administered 2023-03-30: 0.5 mg via INTRAVENOUS
  Filled 2023-03-30: qty 1

## 2023-03-30 MED ORDER — BUPIVACAINE LIPOSOME 1.3 % IJ SUSP
INTRAMUSCULAR | Status: DC | PRN
  Administered 2023-03-30: 20 mL

## 2023-03-30 MED ORDER — KETAMINE HCL 10 MG/ML IJ SOLN
INTRAMUSCULAR | Status: DC | PRN
  Administered 2023-03-30: 30 mg via INTRAVENOUS
  Administered 2023-03-30: 20 mg via INTRAVENOUS

## 2023-03-30 MED ORDER — LIDOCAINE 2% (20 MG/ML) 5 ML SYRINGE
INTRAMUSCULAR | Status: DC | PRN
  Administered 2023-03-30: 100 mg via INTRAVENOUS

## 2023-03-30 MED ORDER — LIDOCAINE HCL (PF) 2 % IJ SOLN
INTRAMUSCULAR | Status: DC | PRN
  Administered 2023-03-30: 1.5 mg/kg/h via INTRADERMAL

## 2023-03-30 MED ORDER — LIDOCAINE HCL (PF) 2 % IJ SOLN
INTRAMUSCULAR | Status: AC
  Filled 2023-03-30: qty 5

## 2023-03-30 MED ORDER — GABAPENTIN 300 MG PO CAPS
300.0000 mg | ORAL_CAPSULE | ORAL | Status: AC
  Administered 2023-03-30: 300 mg via ORAL
  Filled 2023-03-30: qty 1

## 2023-03-30 MED ORDER — GABAPENTIN 300 MG PO CAPS
300.0000 mg | ORAL_CAPSULE | Freq: Three times a day (TID) | ORAL | Status: DC
  Administered 2023-03-30 – 2023-04-03 (×10): 300 mg via ORAL
  Filled 2023-03-30 (×10): qty 1

## 2023-03-30 MED ORDER — FENTANYL CITRATE (PF) 100 MCG/2ML IJ SOLN
INTRAMUSCULAR | Status: DC | PRN
  Administered 2023-03-30 (×2): 100 ug via INTRAVENOUS
  Administered 2023-03-30 (×2): 50 ug via INTRAVENOUS

## 2023-03-30 MED ORDER — MIDAZOLAM HCL 5 MG/5ML IJ SOLN
INTRAMUSCULAR | Status: DC | PRN
  Administered 2023-03-30: 2 mg via INTRAVENOUS

## 2023-03-30 MED ORDER — DOCUSATE SODIUM 100 MG PO CAPS
100.0000 mg | ORAL_CAPSULE | Freq: Two times a day (BID) | ORAL | Status: DC
  Administered 2023-03-30 – 2023-04-03 (×8): 100 mg via ORAL
  Filled 2023-03-30 (×8): qty 1

## 2023-03-30 MED ORDER — PROCHLORPERAZINE EDISYLATE 10 MG/2ML IJ SOLN: 10.0000 mg | INTRAMUSCULAR | Status: DC | PRN

## 2023-03-30 SURGICAL SUPPLY — 59 items
ANTIFOG SOL W/FOAM PAD STRL (MISCELLANEOUS) ×2
BAG COUNTER SPONGE SURGICOUNT (BAG) ×2 IMPLANT
BLADE SURG SZ11 CARB STEEL (BLADE) ×2 IMPLANT
CHLORAPREP W/TINT 26 (MISCELLANEOUS) ×2 IMPLANT
COVER MAYO STAND STRL (DRAPES) ×2 IMPLANT
COVER TIP SHEARS 8 DVNC (MISCELLANEOUS) ×2 IMPLANT
DERMABOND ADVANCED .7 DNX12 (GAUZE/BANDAGES/DRESSINGS) IMPLANT
DEVICE TROCAR PUNCTURE CLOSURE (ENDOMECHANICALS) IMPLANT
DRAPE ARM DVNC X/XI (DISPOSABLE) ×6 IMPLANT
DRAPE COLUMN DVNC XI (DISPOSABLE) ×2 IMPLANT
DRIVER NDL LRG 8 DVNC XI (INSTRUMENTS) ×4 IMPLANT
DRIVER NDL MEGA SUTCUT DVNCXI (INSTRUMENTS) ×4 IMPLANT
DRIVER NDLE LRG 8 DVNC XI (INSTRUMENTS) ×4
DRIVER NDLE MEGA SUTCUT DVNCXI (INSTRUMENTS) ×4
ELECT L-HOOK LAP 45CM DISP (ELECTROSURGICAL) ×2
ELECT PENCIL ROCKER SW 15FT (MISCELLANEOUS) ×2 IMPLANT
ELECT REM PT RETURN 15FT ADLT (MISCELLANEOUS) ×2 IMPLANT
ELECTRODE L-HOOK LAP 45CM DISP (ELECTROSURGICAL) ×2 IMPLANT
FORCEPS PROGRASP DVNC XI (FORCEP) ×2 IMPLANT
GLOVE BIO SURGEON STRL SZ7.5 (GLOVE) ×4 IMPLANT
GLOVE INDICATOR 8.0 STRL GRN (GLOVE) ×4 IMPLANT
GOWN STRL REUS W/ TWL XL LVL3 (GOWN DISPOSABLE) ×6 IMPLANT
GOWN STRL REUS W/TWL XL LVL3 (GOWN DISPOSABLE) ×6
GRASPER SUT TROCAR 14GX15 (MISCELLANEOUS) IMPLANT
GRASPER TIP-UP FEN DVNC XI (INSTRUMENTS) ×2 IMPLANT
IRRIG SUCT STRYKERFLOW 2 WTIP (MISCELLANEOUS)
IRRIGATION SUCT STRKRFLW 2 WTP (MISCELLANEOUS) IMPLANT
KIT BASIN OR (CUSTOM PROCEDURE TRAY) ×2 IMPLANT
KIT TURNOVER KIT A (KITS) IMPLANT
MANIFOLD NEPTUNE II (INSTRUMENTS) ×2 IMPLANT
MESH SOFT 12X12IN BARD (Mesh General) IMPLANT
NDL SPNL 18GX3.5 QUINCKE PK (NEEDLE) ×2 IMPLANT
NEEDLE SPNL 18GX3.5 QUINCKE PK (NEEDLE) ×2
PACK CARDIOVASCULAR III (CUSTOM PROCEDURE TRAY) ×2 IMPLANT
SCISSORS MNPLR CVD DVNC XI (INSTRUMENTS) ×2 IMPLANT
SEAL UNIV 5-12 XI (MISCELLANEOUS) ×6 IMPLANT
SET TUBE SMOKE EVAC HIGH FLOW (TUBING) ×2 IMPLANT
SOL ELECTROSURG ANTI STICK (MISCELLANEOUS) ×2
SOLUTION ANTFG W/FOAM PAD STRL (MISCELLANEOUS) ×2 IMPLANT
SOLUTION ELECTROSURG ANTI STCK (MISCELLANEOUS) ×2 IMPLANT
SUT MNCRL AB 4-0 PS2 18 (SUTURE) ×2 IMPLANT
SUT STRAFIX PDS 18 CTX (SUTURE) IMPLANT
SUT STRAFIX SYMMETRIC 0-0 24 (SUTURE)
SUT STRAFIX SYMMETRIC 1-0 12 (SUTURE)
SUT STRAFIX SYMMETRIC 1-0 24 (SUTURE) ×4
SUT STRTFX SPIRAL PDS+ 2-0 23 (SUTURE)
SUT VLOC 180 0 6IN GS21 (SUTURE) IMPLANT
SUT VLOC 180 0 9IN GS21 (SUTURE) IMPLANT
SUT VLOC 180 2-0 9IN GS21 (SUTURE) IMPLANT
SUTURE STRAFIX SYMMETRC 0-0 24 (SUTURE) IMPLANT
SUTURE STRAFIX SYMMETRC 1-0 12 (SUTURE) IMPLANT
SUTURE STRAFIX SYMMETRC 1-0 24 (SUTURE) IMPLANT
SUTURE STRTFX SPRL PDS+ 2-0 23 (SUTURE) IMPLANT
SYR 20ML LL LF (SYRINGE) ×2 IMPLANT
TAPE STRIPS DRAPE STRL (GAUZE/BANDAGES/DRESSINGS) ×2 IMPLANT
TOWEL OR 17X26 10 PK STRL BLUE (TOWEL DISPOSABLE) ×2 IMPLANT
TOWEL OR NON WOVEN STRL DISP B (DISPOSABLE) IMPLANT
TROCAR ADV FIXATION 12X100MM (TROCAR) ×2 IMPLANT
TROCAR Z-THREAD FIOS 5X100MM (TROCAR) ×2 IMPLANT

## 2023-03-30 NOTE — Progress Notes (Signed)
1315:The patient was assisted to the toilet from the stretcher. The patient was wearing nonslip sock and denied any dizziness. The patient's gate was normal. The patient was encouraged to sit on the toilet but stated he was unable to sit and urinate. The patient was then instructed to hold on to the bars on the wall and pull the cord for assistance after he was done using the bathroom. This nurse was standing outside of the bathroom with the door slightly cracked for privacy. This nurse heard a thumping noise and looked in to find the patient in the floor. The patient had a +LOC and this nurse called for assistance. Defib pads were placed but the patient maintained a pulse. The patient regained consciousness, was A/o X's 4, and following commands. The patient was then lifted onto the stretcher and returned to bay 10 for vitals. Dr. Desmond Lope and Dr. Eduard Clos responded to the fall and notified Dr. Dossie Der as he was in the OR at the time. Dr Sampson Goon ordered a LR bolus and albumin at this time.  1500: Dr. Andrey Campanile came to eval the patient and ordered CT/EKG until Dr. Dossie Der arrived.   1515:EKG was performed and showed to Dr. Dossie Der and new orders were placed for progressive level of care at that time for closer monitoring post fall.

## 2023-03-30 NOTE — Discharge Instructions (Signed)
 VENTRAL HERNIA REPAIR POST OPERATIVE INSTRUCTIONS  Thinking Clearly  The anesthesia may cause you to feel different for 1 or 2 days. Do not drive, drink alcohol, or make any big decisions for at least 2 days.  Nutrition When you wake up, you will be able to drink small amounts of liquid. If you do not feel sick, you can slowly advance your diet to regular foods. Continue to drink lots of fluids, usually about 8 to 10 glasses per day. Eat a high-fiber diet so you don't strain during bowel movements. High-Fiber Foods Foods high in fiber include beans, bran cereals and whole-grain breads, peas, dried fruit (figs, apricots, and dates), raspberries, blackberries, strawberries, sweet corn, broccoli, baked potatoes with skin, plums, pears, apples, greens, and nuts. Activity Slowly increase your activity. Be sure to get up and walk every hour or so to prevent blood clots. No heavy lifting or strenuous activity for 4 weeks following surgery to prevent hernias at your incision sites or recurrence of your hernia. It is normal to feel tired. You may need more sleep than usual.  Get your rest but make sure to get up and move around frequently to prevent blood clots and pneumonia.  Work and Return to School You can go back to work when you feel well enough. Discuss the timing with your surgeon. You can usually go back to school or work 1 week or less after an laparoscopic or an open repair. If your work requires heavy lifting or strenuous activity you need to be placed on light duty for 4 weeks following surgery. You can return to gym class, sports or other physical activities 4 weeks after surgery.  Wound Care You may experience significant bruising throughout the abdominal wall that may track down into the groin including into the scrotum in males.  Rest, elevating the groin and scrotum above the level of the heart, ice and compression with tight fitting underwear or an abdominal binder can help.   Always wash your hands before and after touching near your incision site. Do not soak in a bathtub until cleared at your follow up appointment. You may take a shower 24 hours after surgery. A small amount of drainage from the incision is normal. If the drainage is thick and yellow or the site is red, you may have an infection, so call your surgeon. If you have a drain in one of your incisions, it will be taken out in office when the drainage stops. Steri-Strips will fall off in 7 to 10 days or they will be removed during your first office visit. If you have dermabond glue covering over the incision, allow the glue to flake off on its own. Protect the new skin, especially from the sun. The sun can burn and cause darker scarring. Your scar will heal in about 4 to 6 weeks and will become softer and continue to fade over the next year.  The cosmetic appearance of the incisions will improve over the course of the first year after surgery. Sensation around your incision will return in a few weeks or months.  Bowel Movements After intestinal surgery, you may have loose watery stools for several days. If watery diarrhea lasts longer than 3 days, contact your surgeon. Pain medication (narcotics) can cause constipation. Increase the fiber in your diet with high-fiber foods if you are constipated. You can take an over the counter stool softener like Colace to avoid constipation.  Additional over the counter medications can also be used   if Colace isn't sufficient (for example, Milk of Magnesia or Miralax).  Pain The amount of pain is different for each person. Some people need only 1 to 3 doses of pain control medication, while others need more. Take alternating doses of tylenol and ibuprofen around the clock for the first five days following surgery.  This will provide a baseline of pain control and help with inflammation.  Take the narcotic pain medication in addition if needed for severe pain.  Contact  Your Surgeon at [phone], if you have: Pain that will not go away Pain that gets worse A fever of more than 101F (38.3C) Repeated vomiting Swelling, redness, bleeding, or bad-smelling drainage from your wound site Strong abdominal pain No bowel movement or unable to pass gas for 3 days Watery diarrhea lasting longer than 3 days  Pain Control The goal of pain control is to minimize pain, keep you moving and help you heal. Your surgical team will work with you on your pain plan. Most often a combination of therapies and medications are used to control your pain. You may also be given medication (local anesthetic) at the surgical site. This may help control your pain for several days. Extreme pain puts extra stress on your body at a time when your body needs to focus on healing. Do not wait until your pain has reached a level "10" or is unbearable before telling your doctor or nurse. It is much easier to control pain before it becomes severe. Following a laparoscopic procedure, pain is sometimes felt in the shoulder. This is due to the gas inserted into your abdomen during the procedure. Moving and walking helps to decrease the gas and the right shoulder pain.  Use the guide below for ways to manage your post-operative pain. Learn more by going to facs.org/safepaincontrol.  How Intense Is My Pain Common Therapies to Feel Better       I hardly notice my pain, and it does not interfere with my activities.  I notice my pain and it distracts me, but I can still do activities (sitting up, walking, standing).  Non-Medication Therapies  Ice (in a bag, applied over clothing at the surgical site), elevation, rest, meditation, massage, distraction (music, TV, play) walking and mild exercise Splinting the abdomen with pillows +  Non-Opioid Medications Acetaminophen (Tylenol) Non-steroidal anti-inflammatory drugs (NSAIDS) Aspirin, Ibuprofen (Motrin, Advil) Naproxen (Aleve) Take these as  needed, when you feel pain. Both acetaminophen and NSAIDs help to decrease pain and swelling (inflammation).      My pain is hard to ignore and is more noticeable even when I rest.  My pain interferes with my usual activities.  Non-Medication Therapies  +  Non-Opioid medications  Take on a regular schedule (around-the-clock) instead of as needed. (For example, Tylenol every 6 hours at 9:00 am, 3:00 pm, 9:00 pm, 3:00 am and Motrin every 6 hours at 12:00 am, 6:00 am, 12:00 pm, 6:00 pm)         I am focused on my pain, and I am not doing my daily activities.  I am groaning in pain, and I cannot sleep. I am unable to do anything.  My pain is as bad as it could be, and nothing else matters.  Non-Medication Therapies  +  Around-the-Clock Non-Opioid Medications  +  Short-acting opioids  Opioids should be used with other medications to manage severe pain. Opioids block pain and give a feeling of euphoria (feel high). Addiction, a serious side effect of opioids, is   rare with short-term (a few days) use.  Examples of short-acting opioids include: Tramadol (Ultram), Hydrocodone (Norco, Vicodin), Hydromorphone (Dilaudid), Oxycodone (Oxycontin)     The above directions have been adapted from the [employer] Surgical Patient Education Program.  Please refer to the ACS website if needed: https://[email or web address].org/-/media/files/education/[email or web address]-ed/[email or web address]_[email or web address].ashx   Paul Stechschulte, MD [facility] [address], [address], [place], [place]  [address] ?  [address] [address], [place], [place]   [address] [phone] ? [phone] ? FAX [phone] Web site: [email or web address]  

## 2023-03-30 NOTE — Op Note (Signed)
Patient: Gregory Jordan (02-Dec-1965, [id number])  Date of Surgery: 03/30/2023  Preoperative Diagnosis: Incisional (3.6 cm x 15.1 cm on preoperative CT) and left inguinal hernias  Postoperative Diagnosis: Incisional (3.6 cm x 15.1 cm on preoperative CT) and bilateral inguinal hernias  Surgical Procedure:  ROBOTIC INCISIONAL HERNIA REPAIR WITH MESH BILATERAL POSTERIOR RECTUS MYOFASCIAL RELEASE XI ROBOTIC ASSISTED BILATERAL INGUINAL HERNIA REPAIR WITH MESH   Operative Team Members:  Surgeons and Role:    * Gregory Jordan, Gregory Hopes, MD - Primary   Anesthesiologist: Gregory Adu, MD CRNA: Gregory Clay, CRNA; Gregory Route, CRNA   Anesthesia: General   Fluids:  Total I/O In: 800 [I.V.:700; IV Piggyback:100] Out: 50 [Blood:50]  Complications: None  Drains:  None  Specimen: None  Disposition:  PACU - hemodynamically stable.  Plan of Care: Admit for overnight observation  Indications for Procedure:   Gregory Jordan is an 57 y.o. 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 He also has a small fat-containing left inguinal hernia on CT. I recommended robotic incisional hernia and left inguinal hernia repair with mesh. Specifically, I recommended a robotic incisional hernia repair with mesh, robotic left inguinal hernia repair with mesh, bilateral posterior rectus myofascial release, and bilateral transversus abdominis myofascial release. We discussed this procedure as well as its risk, benefits, and alternatives. After full discussion all questions answered the patient granted consent to proceed.    Findings:  Hernia Location:  Ventral hernia location:  Epigastric (M2), Umbilical (M3), and Infraumbilical (M4)  Left Inguinal: Indirect fat containing inguinal hernia  Right Inguinal: Direct fat containing inguinal hernia Hernia Size:  3.6 cm x 15.1 cm Swiss cheese ventral hernia containing fatty tissue Mesh Size &Type:  37 cm tall x 22 cm wide Bard Soft Mesh Mesh Position: Sublay - Retromuscular Myofascial Releases: Bilateral posterior rectus myofascial release   Description of Procedure: The patient was positioned supine, moderately flexed at the umbilical level, padded and secured on the operating table.  A timeout procedure was performed.    What is described is a robotic, totally extraperitoneal retromuscular incisional hernia repair with bilateral rectus myofascial release and retromuscular mesh placement.  Laparoscopic Portion: The retrorectus space was entered in the LEFT hypochondrium, at approximately the midclavicular line utilizing a 5 mm optical-viewing trocar.  Upon safe entry into this space, it was insufflated while performing a blunt dissection with the camera still in the optical trocar. A rectus myofascial release was performed on the LEFT side. Dissection was carried out laterally in the retromuscular plane to the edge of the rectus sheath progressively disconnecting the rectus muscle from the underlying posterior rectus sheath. Both the segmental innervation as well as the intercostal artery and vein brances to the rectus muscle were individually preserved.    During the left sided retrorectus dissection, a 12 mm trocar was placed into the lateral most edge of the retrorectus space.  With these initial trocars in position, the medial most aspect of the retrorectus plane was identified, and the posterior sheath was visualized as it inserted on the linea alba. The posterior sheath was incised with cautery entering the preperitoneal plane. A crossover was performed dissecting under the linea alba in the preperitoneal plane  until the  right rectus sheath was identified.  After identification of the right rectus sheath, it was incised vertically to enter the retrorectus space on the right. A rectus myofascial release was performed on the RIGHT side.  Blunt dissection was carried out laterally in the retromuscular plane to the edge of the rectus sheath progressively disconnecting the rectus muscle from the underlying posterior rectus sheath. Both the segmental innervation as well as the intercostal artery and vein brances to the rectus muscle were individually preserved.   At this juncture, both retrorectus planes were initially connected to each other and there was space for further trocar placement. An 8 mm robotic trocar was placed in the midclavicular line in right retrorectus space.  A 8mm robotic trocar was placed within the left rectus musculature in the upper abdomen, and not through the linea alba.  The initial 5 mm access trocar in the midclavicular line within the left retrorectus space was switched out for an 8 mm robotic trocar.   Robotic Portion: The Intuitive daVinci Xi surgical robot was docked in the standard fashion and the procedure begun from the robotic console. A Prograsp instrument and monopolar shears were used for the dissection.  Dissection was carried down inferiorly preserving the peritoneum and the preperitoneal fat in the midline as it was gently dissected off of the overlying linea alba.  On the right side, the posterior rectus sheath was progressively disconnected from its insertion on the linea alba. This allowed for progression of the right side rectus myofascial release.  The rectus myofascial release accomplished medialization of the posterior rectus sheath towards the midline and disinsertion of the rectus muscle from its surrounding fascia, and thus its encasement in the rectus sheath, allowing for widening of the rectus muscle and transfer of the rectus flap towards the midline.  This will allow for  future inset of the medial aspect of the flap for abdominal wall reconstruction.  Similarly, on the left side, the posterior rectus sheath was also progressively disconnected from its insertion on the linea alba.  This allowed for progression of the left side rectus myofascial release.  The rectus myofascial release accomplished medialization of the posterior rectus sheath towards the midline and disinsertion of the rectus muscle from its surrounding fascia, and thus its encasement in the rectus sheath, allowing for widening of the rectus muscle and transfer of the rectus flap towards the midline.  This will allow for future inset of the medial aspect of the flap for abdominal wall reconstruction.  During the dissection of the midline the hernia defect was identified and the hernia sac was not reducible, therefore the hernia peritoneum was incised in places which left small defects within the peritoneum in the midline.  These defect was later closed with a running 2-0 Ethicon Stratafix Spiral PDS suture, incorporating some omentum to patch the peritoneal closure.  Both the left and the right rectus myofascial releases were performed towards the lower abdomen, past the arcuate line bilaterally.  During this dissection, the peritoneum and preperitoneal fat in the midline were further preserved below the hernia as they were dissected off of the overlying linea alba.   There was an direct fat containing inguinal hernia on the RIGHT.  Dissection was carried out in the pre peritoneal space down to the level of the hernia sac which was reduced into the peritoneal cavity completely.  The cord contents were parietalized and preserved.  A large pre peritoneal dissection was performed to uncover the direct, indirect,  femoral and obturator spaces.  Cooper's ligament was uncovered medially and the psoas muscle uncovered laterally.  There was an indirect fat containing inguinal hernia on the LEFT.  Dissection was  carried out in the pre peritoneal space down to the level of the hernia sac which was reduced into the peritoneal cavity completely.  The cord contents were parietalized and preserved.  A large pre peritoneal dissection was performed to uncover the direct, indirect, femoral and obturator spaces.  Cooper's ligament was uncovered medially and the psoas muscle uncovered laterally.  The hernia defect area was now visualized fully.  The hernia defects were located in the bilateral inguinal, Epigastric (M2), Umbilical (M3), and Infraumbilical (M4) regions.   The midline hernia defect was closed utilizing a continuous, #1 Ethicon Stratafix Symmetric PDS Plus suture.  The hernia defect, and subsequently the rectus musculature, came together well for a complete abdominal wall reconstruction.  The dissected out retrorectus space was measured with a metric ruler so as to determine the size of the proposed mesh.    A piece of Bard Soft was opened and trimmed to 37 cm tall x 22 cm wide. The mesh was advanced into the retrorectus space and the mesh positioned flat against the intact posterior rectus sheaths. The mesh was fixated to Cooper's ligament bilaterally using 0 Ethibond suture. It occupied the entire retromuscular plane, and also covered all of the trocars.  The trocars were removed and the skin closed with 4-0 Monocryl subcuticular sutures and skin glue.  The robot was undocked and the laparoscope was inserted, inspecting for hemostasis.    Laparoscopic Portion:  A transversus abdominis plane (TAP) block was performed bilaterally with a mixture of marcaine and Exparel.  The anesthetic was first injected into the plane between the transversus abdominis and internal abdominal oblique muscles on the left. The TAP was repeated on the contralateral side.   The skin was closed with 4-0 Monocryl and skin glue.  All sponge and needle counts were correct.    Ivar Drape, MD General, Bariatric, & Minimally  Invasive Surgery [facility], [place]

## 2023-03-30 NOTE — Anesthesia Procedure Notes (Signed)
Procedure Name: Intubation Date/Time: 03/30/2023 7:38 AM  Performed by: Doran Clay, CRNAPre-anesthesia Checklist: Patient identified, Emergency Drugs available, Suction available, Patient being monitored and Timeout performed Patient Re-evaluated:Patient Re-evaluated prior to induction Oxygen Delivery Method: Circle system utilized Preoxygenation: Pre-oxygenation with 100% oxygen Induction Type: IV induction Ventilation: Mask ventilation without difficulty Laryngoscope Size: Mac and 4 Grade View: Grade II Tube type: Oral Tube size: 8.0 mm Number of attempts: 1 Airway Equipment and Method: Stylet Placement Confirmation: ETT inserted through vocal cords under direct vision, positive ETCO2 and breath sounds checked- equal and bilateral Secured at: 23 cm Tube secured with: Tape Dental Injury: Teeth and Oropharynx as per pre-operative assessment

## 2023-03-30 NOTE — Progress Notes (Signed)
Assessed pt at request of pacu since primary surgeon is in OR Fall/passed out while trying to void in bathroom and hit head, +LOC Nurse reports   Pt now nauseated, c/o scrotal pain Hasn't voided since surgery Bladder scan showed <100 cc earlier A little tachy since event Normotensive  BP 110/77 (BP Location: Right Arm)   Pulse (!) 107   Temp 98.7 F (37.1 C)   Resp 14   Ht 5\' 8"  (1.727 m)   Wt 109.8 kg   SpO2 91%   BMI 36.80 kg/m      Latest Ref Rng & Units 03/30/2023    2:04 PM 03/26/2023    8:32 AM 01/01/2023   11:26 AM  CBC  WBC 4.0 - 10.5 K/uL 20.0  9.0  6.0   Hemoglobin 13.0 - 17.0 g/dL 64.4  03.4  74.2   Hematocrit 39.0 - 52.0 % 39.8  47.1  46.9   Platelets 150 - 400 K/uL 293  257  230   Bs was 218 Denies chest pain/sob/chest tightness Soft, incisions ok, abd soft, expected mild TTP L scrotum a little full compared to Rt, and ttp; no obvious bruising.   Will check 12 lead EKG Will check head ct since fall and loc and now nausea Will update primary surgeon May need I/o if no spont void within next 2 hrs-3 hrs  Mary Sella. Andrey Campanile, MD, FACS General, Bariatric, & Minimally Invasive Surgery [facility],  [facility]

## 2023-03-30 NOTE — H&P (Signed)
Admitting Physician: Hyman Hopes Gregory Jordan  Service: General Surgery  CC: Hernia  Subjective   HPI: Gregory Jordan is an 57 y.o. male who is here for hernia repair  Past Medical History:  Diagnosis Date   Pneumonia     Past Surgical History:  Procedure Laterality Date   ANTERIOR CRUCIATE LIGAMENT REPAIR Right    BOWEL RESECTION N/A 03/21/2016   Procedure: SMALL BOWEL RESECTION;  Surgeon: Jimmye Norman, MD;  Location: [facility] OR;  Service: General;  Laterality: N/A;   EXTERNAL FIXATION LEG Right 03/21/2016   Procedure: EXTERNAL FIXATION LEG;  Surgeon: Eldred Manges, MD;  Location: [facility] OR;  Service: Orthopedics;  Laterality: Right;   EYE SURGERY     I & D EXTREMITY Right 03/21/2016   Procedure: IRRIGATION AND DEBRIDEMENT RIGHT TIBIA;  Surgeon: Eldred Manges, MD;  Location: [facility] OR;  Service: Orthopedics;  Laterality: Right;   LAPAROTOMY N/A 03/21/2016   Procedure: EXPLORATORY LAPAROTOMY;  Surgeon: Jimmye Norman, MD;  Location: [facility] OR;  Service: General;  Laterality: N/A;   ORIF FIBULA FRACTURE Right 03/27/2016   Procedure: OPEN REDUCTION INTERNAL FIXATION (ORIF) RIGHT FIBULA AND PILON, WITH EXTERNAL FIXATION REMOVAL RIGHT LEG;  Surgeon: Eldred Manges, MD;  Location: [facility] OR;  Service: Orthopedics;  Laterality: Right;   ORIF ORBITAL FRACTURE Right 03/25/2016   Procedure: OPEN REDUCTION INTERNAL FIXATION (ORIF) ORBITAL FRACTURE/RIGHT;  Surgeon: Alena Bills Dillingham, DO;  Location: [facility] OR;  Service: Plastics;  Laterality: Right;    Family History  Problem Relation Age of Onset   Hypotension Father     Social:  reports that he has never smoked. He has never used smokeless tobacco. He reports that he does not currently use alcohol. He reports that he does not use drugs.  Allergies:  Allergies  Allergen Reactions   No Known Allergies     Medications: Current Outpatient Medications  Medication Instructions   Hydrocortisone (CORTIZONE-10 ECZEMA EX) 1 application , Topical, Daily PRN    Magnesium 250 mg, Oral, Daily   Oxycodone HCl 10 MG TABS TAKE 1 TABLET BY MOUTH EVERY 6 HOURS AS NEEDED FOR SEVERE PAIN   Vitamin D3 2,000 Units, Oral, Daily   Vitamin E 100 mg, Oral, Daily   zinc gluconate 50 mg, Oral, Daily    ROS - all of the below systems have been reviewed with the patient and positives are indicated with bold text General: chills, fever or night sweats Eyes: blurry vision or double vision ENT: epistaxis or sore throat Allergy/Immunology: itchy/watery eyes or nasal congestion Hematologic/Lymphatic: bleeding problems, blood clots or swollen lymph nodes Endocrine: temperature intolerance or unexpected weight changes Breast: new or changing breast lumps or nipple discharge Resp: cough, shortness of breath, or wheezing CV: chest pain or dyspnea on exertion GI: as per HPI GU: dysuria, trouble voiding, or hematuria MSK: joint pain or joint stiffness Neuro: TIA or stroke symptoms Derm: pruritus and skin lesion changes Psych: anxiety and depression  Objective   PE Blood pressure (!) 136/92, pulse 70, temperature 97.9 F (36.6 C), temperature source Oral, resp. rate 18, height 5\' 8"  (1.727 m), weight 109.8 kg, SpO2 99%. Constitutional: NAD; conversant; no deformities Eyes: Moist conjunctiva; no lid lag; anicteric; PERRL Neck: Trachea midline; no thyromegaly Lungs: Normal respiratory effort; no tactile fremitus CV: RRR; no palpable thrills; no pitting edema GI: Abd midline hernia, left inguinal hernia; no palpable hepatosplenomegaly MSK: Normal range of motion of extremities; no clubbing/cyanosis Psychiatric: Appropriate affect; alert and oriented x3 Lymphatic: No  palpable cervical or axillary lymphadenopathy  No results found for this or any previous visit (from the past 24 hour(s)).  Imaging Orders  No imaging studies ordered today  CT Abd/Pel 01/01/23  Small-to-moderate periumbilical fat containing hernia as well as additional midline fat containing ventral  hernias. There is a small fat containing left inguinal hernia.  Musculoskeletal: No suspicious osseous lesions. There are mild multilevel degenerative changes in the visualized spine.  IMPRESSION: 1. No acute inflammatory process identified within the abdomen or pelvis. 2. Multiple other nonacute observations, as described above.  3.6 cm x 15.1 cm Swiss cheese ventral hernia defect    Assessment and Plan   Gregory Jordan is an 58 y.o. 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 He also has a small fat-containing left inguinal hernia on CT. I recommended robotic incisional hernia and left inguinal hernia repair with mesh. Specifically, I recommended a robotic incisional hernia repair with mesh, robotic left inguinal hernia repair with mesh, bilateral posterior rectus myofascial release, and bilateral transversus abdominis myofascial release. We discussed this procedure as well as its risk, benefits, and alternatives. After full discussion all questions answered the patient granted consent to proceed.     Quentin Ore, MD  [facility] Use [email or web address] to contact on call provider

## 2023-03-30 NOTE — Anesthesia Postprocedure Evaluation (Signed)
Anesthesia Post Note  Patient: Gregory Jordan  Procedure(s) Performed: ROBOTIC INCISIONAL HERNIA REPAIR WITH MESH, BILATERAL POSTERIOR RECTUS MYOFASCIAL RELEASE, POSSIBLE BILATERAL TRANSVERSE ABDOMINAL MYOFASCIAL RELEASE XI ROBOTIC ASSISTED LEFT INGUINAL HERNIA REPAIR WITH MESH (Left)     Patient location during evaluation: PACU Anesthesia Type: General Level of consciousness: awake and alert Pain management: pain level controlled Vital Signs Assessment: post-procedure vital signs reviewed and stable Respiratory status: spontaneous breathing, nonlabored ventilation, respiratory function stable and patient connected to nasal cannula oxygen Cardiovascular status: blood pressure returned to baseline and stable Postop Assessment: no apparent nausea or vomiting Anesthetic complications: no Comments: Pt had a fall while in the bathroom post surgery(see notes). Alert and oriented x 4. VSS. Surgery ordered a head CT due to the fall and hit on the head.  No notable events documented.  Last Vitals:  Vitals:   03/30/23 1800 03/30/23 1855  BP: (!) 128/112 134/87  Pulse: (!) 124 (!) 106  Resp:  20  Temp:  37.2 C  SpO2: 93% 95%    Last Pain:  Vitals:   03/30/23 1855  TempSrc: Oral  PainSc:                  [name],W. EDMOND

## 2023-03-30 NOTE — Progress Notes (Signed)
Called to PACU that patient had fallen while in the bathroom trying to urinate. Pt had been having trouble urinating in the bed so he was taken to the bathroom to see if he would have success standing. While standing, the nurse outside heard him hit the floor. He was picked up and placed on a stretcher. I arrived after Dr. Desmond Lope who said that by the time he arrived the patient was answering questions appropriately. I ordered a liter bolus of LR and followed that with 250cc of albumin. Dr. Dossie Der ordered a CBC. The patient did state that his head was sore from the fall but no abrasions were noted and the patient was alert and oriented when spoken to. Dr. Dossie Der will follow up and a scan was not ordered at this time. Pt continues to be alert and oriented only complaining about his inability to urinate. Bladder scan shows less than 200cc in his bladder.

## 2023-03-30 NOTE — Transfer of Care (Signed)
Immediate Anesthesia Transfer of Care Note  Patient: Gregory Jordan  Procedure(s) Performed: ROBOTIC INCISIONAL HERNIA REPAIR WITH MESH, BILATERAL POSTERIOR RECTUS MYOFASCIAL RELEASE, POSSIBLE BILATERAL TRANSVERSE ABDOMINAL MYOFASCIAL RELEASE XI ROBOTIC ASSISTED LEFT INGUINAL HERNIA REPAIR WITH MESH (Left)  Patient Location: PACU  Anesthesia Type:General  Level of Consciousness: drowsy  Airway & Oxygen Therapy: Patient Spontanous Breathing and Patient connected to face mask oxygen  Post-op Assessment: Report given to RN and Post -op Vital signs reviewed and stable  Post vital signs: Reviewed and stable  Last Vitals:  Vitals Value Taken Time  BP    Temp    Pulse 102 03/30/23 1101  Resp 18 03/30/23 1101  SpO2 95 % 03/30/23 1101  Vitals shown include unfiled device data.  Last Pain:  Vitals:   03/30/23 0607  TempSrc: Oral  PainSc:          Complications: No notable events documented.

## 2023-03-31 ENCOUNTER — Encounter (HOSPITAL_COMMUNITY): Payer: Self-pay | Admitting: Surgery

## 2023-03-31 DIAGNOSIS — E66813 Obesity, class 3: Secondary | ICD-10-CM | POA: Diagnosis present

## 2023-03-31 DIAGNOSIS — D62 Acute posthemorrhagic anemia: Secondary | ICD-10-CM | POA: Diagnosis not present

## 2023-03-31 DIAGNOSIS — K439 Ventral hernia without obstruction or gangrene: Secondary | ICD-10-CM | POA: Diagnosis present

## 2023-03-31 DIAGNOSIS — Z79899 Other long term (current) drug therapy: Secondary | ICD-10-CM | POA: Diagnosis not present

## 2023-03-31 DIAGNOSIS — K432 Incisional hernia without obstruction or gangrene: Secondary | ICD-10-CM | POA: Diagnosis present

## 2023-03-31 DIAGNOSIS — Z6836 Body mass index (BMI) 36.0-36.9, adult: Secondary | ICD-10-CM | POA: Diagnosis not present

## 2023-03-31 DIAGNOSIS — K4020 Bilateral inguinal hernia, without obstruction or gangrene, not specified as recurrent: Secondary | ICD-10-CM | POA: Diagnosis present

## 2023-03-31 DIAGNOSIS — R000 Tachycardia, unspecified: Secondary | ICD-10-CM | POA: Diagnosis present

## 2023-03-31 LAB — CBC
HCT: 35.1 % — ABNORMAL LOW (ref 39.0–52.0)
Hemoglobin: 11.4 g/dL — ABNORMAL LOW (ref 13.0–17.0)
MCH: 31.3 pg (ref 26.0–34.0)
MCHC: 32.5 g/dL (ref 30.0–36.0)
MCV: 96.4 fL (ref 80.0–100.0)
Platelets: 241 10*3/uL (ref 150–400)
RBC: 3.64 MIL/uL — ABNORMAL LOW (ref 4.22–5.81)
RDW: 13.9 % (ref 11.5–15.5)
WBC: 12.4 10*3/uL — ABNORMAL HIGH (ref 4.0–10.5)
nRBC: 0 % (ref 0.0–0.2)

## 2023-03-31 LAB — BASIC METABOLIC PANEL
Anion gap: 9 (ref 5–15)
BUN: 13 mg/dL (ref 6–20)
CO2: 20 mmol/L — ABNORMAL LOW (ref 22–32)
Calcium: 8.6 mg/dL — ABNORMAL LOW (ref 8.9–10.3)
Chloride: 106 mmol/L (ref 98–111)
Creatinine, Ser: 0.75 mg/dL (ref 0.61–1.24)
GFR, Estimated: >60 mL/min (ref >60)
Glucose, Bld: 124 mg/dL — ABNORMAL HIGH (ref 70–99)
Potassium: 4.4 mmol/L (ref 3.5–5.1)
Sodium: 135 mmol/L (ref 135–145)

## 2023-03-31 MED ORDER — ORAL CARE MOUTH RINSE: 15.0000 mL | OROMUCOSAL | Status: DC | PRN

## 2023-03-31 NOTE — Plan of Care (Signed)
  Problem: Education: Goal: Knowledge of General Education information will improve Description: Including pain rating scale, medication(s)/side effects and non-pharmacologic comfort measures Outcome: Progressing   Problem: Activity: Goal: Risk for activity intolerance will decrease Outcome: Progressing   Problem: Coping: Goal: Level of anxiety will decrease Outcome: Progressing   Problem: Pain Management: Goal: General experience of comfort will improve Outcome: Progressing   Problem: Safety: Goal: Ability to remain free from injury will improve Outcome: Progressing

## 2023-03-31 NOTE — Progress Notes (Signed)
Progress Note: General Surgery Service   Chief Complaint/Subjective: Lots of bruising in scrotum. Urinating.  Objective: Vital signs in last 24 hours: Temp:  [97.9 F (36.6 C)-99 F (37.2 C)] 98.2 F (36.8 C) (11/20 0625) Pulse Rate:  [97-128] 101 (11/20 0625) Resp:  [12-20] 17 (11/20 0625) BP: (98-153)/(69-112) 141/89 (11/20 0625) SpO2:  [90 %-98 %] 94 % (11/20 0625) Last BM Date : 03/28/23  Intake/Output from previous day: 11/19 0701 - 11/20 0700 In: 1280 [P.O.:480; I.V.:700; IV Piggyback:100] Out: 2250 [Urine:2200; Blood:50] Intake/Output this shift: Total I/O In: 480 [P.O.:480] Out: -   GI: Abd Scrotal swelling with bruising  Lab Results: CBC  Recent Labs    03/30/23 1404 03/31/23 0517  WBC 20.0* 12.4*  HGB 12.7* 11.4*  HCT 39.8 35.1*  PLT 293 241   BMET Recent Labs    03/30/23 1404 03/31/23 0517  NA  --  135  K  --  4.4  CL  --  106  CO2  --  20*  GLUCOSE  --  124*  BUN  --  13  CREATININE 0.80 0.75  CALCIUM  --  8.6*   PT/INR No results for input(s): "LABPROT", "INR" in the last 72 hours. ABG No results for input(s): "PHART", "HCO3" in the last 72 hours.  Invalid input(s): "PCO2", "PO2"  Anti-infectives: Anti-infectives (From admission, onward)    Start     Dose/Rate Route Frequency Ordered Stop   03/30/23 0600  ceFAZolin (ANCEF) IVPB 2g/100 mL premix        2 g 200 mL/hr over 30 Minutes Intravenous On call to O.R. 03/30/23 0541 03/30/23 0740       Medications: Scheduled Meds:  acetaminophen  650 mg Oral Q6H   docusate sodium  100 mg Oral BID   enoxaparin (LOVENOX) injection  40 mg Subcutaneous Q24H   gabapentin  300 mg Oral TID   ketorolac  15 mg Intravenous Q8H   Continuous Infusions: PRN Meds:.HYDROmorphone (DILAUDID) injection, methocarbamol (ROBAXIN) injection, ondansetron (ZOFRAN) IV, oxyCODONE, oxyCODONE, prochlorperazine, simethicone  Assessment/Plan: ROBOTIC INCISIONAL HERNIA REPAIR WITH MESH BILATERAL POSTERIOR  RECTUS MYOFASCIAL RELEASE XI ROBOTIC ASSISTED BILATERAL INGUINAL HERNIA REPAIR WITH MESH  Significant scrotal swelling Not steady on feet yet Pain control only okay No bowel movement yet Plan to keep inpatient till moving bowels and pain control improved  Ice and supportive underwear for scrotum    LOS: 0 days   Quentin Ore, MD  [facility] Use [email or web address] to contact on call provider  Daily Billing: [id number] - post op

## 2023-03-31 NOTE — Plan of Care (Signed)
  Problem: Health Behavior/Discharge Planning: Goal: Ability to manage health-related needs will improve Outcome: Progressing   Problem: Clinical Measurements: Goal: Ability to maintain clinical measurements within normal limits will improve Outcome: Progressing Goal: Will remain free from infection Outcome: Progressing Goal: Diagnostic test results will improve Outcome: Progressing Goal: Cardiovascular complication will be avoided Outcome: Progressing   Problem: Activity: Goal: Risk for activity intolerance will decrease Outcome: Progressing   Problem: Pain Management: Goal: General experience of comfort will improve Outcome: Progressing   Problem: Skin Integrity: Goal: Risk for impaired skin integrity will decrease Outcome: Progressing   Problem: Education: Goal: Knowledge of General Education information will improve Description: Including pain rating scale, medication(s)/side effects and non-pharmacologic comfort measures Outcome: Adequate for Discharge   Problem: Clinical Measurements: Goal: Respiratory complications will improve Outcome: Adequate for Discharge   Problem: Nutrition: Goal: Adequate nutrition will be maintained Outcome: Adequate for Discharge   Problem: Coping: Goal: Level of anxiety will decrease Outcome: Adequate for Discharge   Problem: Elimination: Goal: Will not experience complications related to bowel motility Outcome: Adequate for Discharge Goal: Will not experience complications related to urinary retention Outcome: Adequate for Discharge   Problem: Safety: Goal: Ability to remain free from injury will improve Outcome: Adequate for Discharge

## 2023-04-01 ENCOUNTER — Inpatient Hospital Stay (HOSPITAL_COMMUNITY): Payer: 59

## 2023-04-01 ENCOUNTER — Other Ambulatory Visit (HOSPITAL_COMMUNITY): Payer: Self-pay

## 2023-04-01 LAB — BASIC METABOLIC PANEL
Anion gap: 9 (ref 5–15)
BUN: 14 mg/dL (ref 6–20)
CO2: 26 mmol/L (ref 22–32)
Calcium: 8.4 mg/dL — ABNORMAL LOW (ref 8.9–10.3)
Chloride: 101 mmol/L (ref 98–111)
Creatinine, Ser: 0.71 mg/dL (ref 0.61–1.24)
GFR, Estimated: >60 mL/min (ref >60)
Glucose, Bld: 103 mg/dL — ABNORMAL HIGH (ref 70–99)
Potassium: 3.6 mmol/L (ref 3.5–5.1)
Sodium: 136 mmol/L (ref 135–145)

## 2023-04-01 LAB — CBC
HCT: 32.2 % — ABNORMAL LOW (ref 39.0–52.0)
HCT: 31.8 % — ABNORMAL LOW (ref 39.0–52.0)
Hemoglobin: 10.2 g/dL — ABNORMAL LOW (ref 13.0–17.0)
Hemoglobin: 10.4 g/dL — ABNORMAL LOW (ref 13.0–17.0)
MCH: 31 pg (ref 26.0–34.0)
MCH: 31.6 pg (ref 26.0–34.0)
MCHC: 31.7 g/dL (ref 30.0–36.0)
MCHC: 32.7 g/dL (ref 30.0–36.0)
MCV: 97.9 fL (ref 80.0–100.0)
MCV: 96.7 fL (ref 80.0–100.0)
Platelets: 218 10*3/uL (ref 150–400)
Platelets: 247 10*3/uL (ref 150–400)
RBC: 3.29 MIL/uL — ABNORMAL LOW (ref 4.22–5.81)
RBC: 3.29 MIL/uL — ABNORMAL LOW (ref 4.22–5.81)
RDW: 13.9 % (ref 11.5–15.5)
RDW: 14 % (ref 11.5–15.5)
WBC: 11.4 10*3/uL — ABNORMAL HIGH (ref 4.0–10.5)
WBC: 12.4 10*3/uL — ABNORMAL HIGH (ref 4.0–10.5)
nRBC: 0 % (ref 0.0–0.2)
nRBC: 0 % (ref 0.0–0.2)

## 2023-04-01 MED ORDER — METOPROLOL TARTRATE 5 MG/5ML IV SOLN
5.0000 mg | Freq: Four times a day (QID) | INTRAVENOUS | Status: DC | PRN
  Administered 2023-04-01: 5 mg via INTRAVENOUS
  Filled 2023-04-01: qty 5

## 2023-04-01 NOTE — Plan of Care (Signed)
  Problem: Activity: Goal: Risk for activity intolerance will decrease Outcome: Progressing   Problem: Coping: Goal: Level of anxiety will decrease Outcome: Progressing   Problem: Pain Management: Goal: General experience of comfort will improve Outcome: Progressing   Problem: Safety: Goal: Ability to remain free from injury will improve Outcome: Progressing

## 2023-04-01 NOTE — Progress Notes (Signed)
Progress Note: General Surgery Service   Chief Complaint/Subjective: Scrotal bruising progressing. Urinating.  Objective: Vital signs in last 24 hours: Temp:  [97.9 F (36.6 C)-98.7 F (37.1 C)] 98 F (36.7 C) (11/21 0504) Pulse Rate:  [95-125] 97 (11/21 0504) Resp:  [16-20] 16 (11/21 0504) BP: (127-149)/(76-95) 135/87 (11/21 0504) SpO2:  [92 %-100 %] 100 % (11/21 0504) Last BM Date : 03/28/23  Intake/Output from previous day: 11/20 0701 - 11/21 0700 In: 960 [P.O.:960] Out: 250 [Urine:250] Intake/Output this shift: No intake/output data recorded.  GI: Abd significant scrotal swelling with bruising  Lab Results: CBC  Recent Labs    03/31/23 0517 04/01/23 0526  WBC 12.4* 11.4*  HGB 11.4* 10.2*  HCT 35.1* 32.2*  PLT 241 218   BMET Recent Labs    03/31/23 0517 04/01/23 0526  NA 135 136  K 4.4 3.6  CL 106 101  CO2 20* 26  GLUCOSE 124* 103*  BUN 13 14  CREATININE 0.75 0.71  CALCIUM 8.6* 8.4*   PT/INR No results for input(s): "LABPROT", "INR" in the last 72 hours. ABG No results for input(s): "PHART", "HCO3" in the last 72 hours.  Invalid input(s): "PCO2", "PO2"  Anti-infectives: Anti-infectives (From admission, onward)    Start     Dose/Rate Route Frequency Ordered Stop   03/30/23 0600  ceFAZolin (ANCEF) IVPB 2g/100 mL premix        2 g 200 mL/hr over 30 Minutes Intravenous On call to O.R. 03/30/23 0541 03/30/23 0740       Medications: Scheduled Meds:  acetaminophen  650 mg Oral Q6H   docusate sodium  100 mg Oral BID   enoxaparin (LOVENOX) injection  40 mg Subcutaneous Q24H   gabapentin  300 mg Oral TID   ketorolac  15 mg Intravenous Q8H   Continuous Infusions: PRN Meds:.HYDROmorphone (DILAUDID) injection, methocarbamol (ROBAXIN) injection, ondansetron (ZOFRAN) IV, mouth rinse, oxyCODONE, oxyCODONE, prochlorperazine, simethicone  Assessment/Plan: ROBOTIC INCISIONAL HERNIA REPAIR WITH MESH BILATERAL POSTERIOR RECTUS MYOFASCIAL RELEASE XI  ROBOTIC ASSISTED BILATERAL INGUINAL HERNIA REPAIR WITH MESH  Significant scrotal swelling Pain control only okay No bowel movement yet Plan to keep inpatient till moving bowels, pain control improves, scrotal swelling improves  Ice and supportive underwear for scrotum    LOS: 1 day   Quentin Ore, MD  [facility] Use [email or web address] to contact on call provider  Daily Billing: [id number] - post op

## 2023-04-01 NOTE — Progress Notes (Signed)
Patient's nurse reached out to the office this evening, I received a page while at 4:51 PM from our office followed by 4:56 PM from the patient's nurse regarding tachycardia for the last several hours in the 120s to 130s, complaint of altered urine stream from scrotal edema which is potentially worsened.  No complaint of chest pain, shortness of breath, dizziness, etc.  Urine output reportedly has been adequate throughout the day.  Patient is alternating elevating his scrotum on rolled towels and using ice.  Dr. Danise Edge note from this morning reviewed as well as patient's morning labs and vital signs trends. Suspect heart rate is pain related.  Orders placed: EKG Chest x-ray Repeat CBC Bladder scan (as needed I&O cath order already in place) As needed metoprolol  Berna Bue MD [facility] 04/01/2023 5:31 PM

## 2023-04-01 NOTE — Progress Notes (Signed)
MD Stechschulte & charge RN Lovette Cliche. Notified of Yellow MEWS & below VS  04/01/23 1615  Vitals  Temp 99 F (37.2 C)  Temp Source Oral  BP (!) 122/92  MAP (mmHg) 101  BP Location Left Arm  BP Method Automatic  Patient Position (if appropriate) Sitting  Pulse Rate Source Monitor  ECG Heart Rate (!) 129  Resp 16  Level of Consciousness  Level of Consciousness Alert  MEWS COLOR  MEWS Score Color Yellow  Oxygen Therapy  SpO2 92 %  O2 Device Room Air  Pain Assessment  Pain Scale 0-10  Pain Score 7  MEWS Score  MEWS Temp 0  MEWS Systolic 0  MEWS Pulse 2  MEWS RR 0  MEWS LOC 0  MEWS Score 2

## 2023-04-02 ENCOUNTER — Other Ambulatory Visit (HOSPITAL_COMMUNITY): Payer: Self-pay

## 2023-04-02 LAB — CBC
HCT: 29.9 % — ABNORMAL LOW (ref 39.0–52.0)
Hemoglobin: 9.4 g/dL — ABNORMAL LOW (ref 13.0–17.0)
MCH: 30.7 pg (ref 26.0–34.0)
MCHC: 31.4 g/dL (ref 30.0–36.0)
MCV: 97.7 fL (ref 80.0–100.0)
Platelets: 222 10*3/uL (ref 150–400)
RBC: 3.06 MIL/uL — ABNORMAL LOW (ref 4.22–5.81)
RDW: 13.9 % (ref 11.5–15.5)
WBC: 8.8 10*3/uL (ref 4.0–10.5)
nRBC: 0 % (ref 0.0–0.2)

## 2023-04-02 LAB — BASIC METABOLIC PANEL
Anion gap: 9 (ref 5–15)
BUN: 11 mg/dL (ref 6–20)
CO2: 25 mmol/L (ref 22–32)
Calcium: 8.2 mg/dL — ABNORMAL LOW (ref 8.9–10.3)
Chloride: 103 mmol/L (ref 98–111)
Creatinine, Ser: 0.71 mg/dL (ref 0.61–1.24)
GFR, Estimated: >60 mL/min (ref >60)
Glucose, Bld: 108 mg/dL — ABNORMAL HIGH (ref 70–99)
Potassium: 3.9 mmol/L (ref 3.5–5.1)
Sodium: 137 mmol/L (ref 135–145)

## 2023-04-02 MED ORDER — DIPHENHYDRAMINE HCL 25 MG PO CAPS
25.0000 mg | ORAL_CAPSULE | Freq: Four times a day (QID) | ORAL | Status: DC | PRN
  Administered 2023-04-02 (×2): 25 mg via ORAL
  Filled 2023-04-02 (×2): qty 1

## 2023-04-02 MED ORDER — POLYETHYLENE GLYCOL 3350 17 G PO PACK
17.0000 g | PACK | Freq: Every day | ORAL | Status: DC
Start: 2023-04-02 — End: 2023-04-03
  Administered 2023-04-02: 17 g via ORAL
  Filled 2023-04-02 (×2): qty 1

## 2023-04-02 MED ORDER — MENTHOL 3 MG MT LOZG: 1.0000 | LOZENGE | OROMUCOSAL | Status: DC | PRN

## 2023-04-02 NOTE — Plan of Care (Signed)
  Problem: Education: Goal: Knowledge of General Education information will improve Description: Including pain rating scale, medication(s)/side effects and non-pharmacologic comfort measures Outcome: Progressing   Problem: Clinical Measurements: Goal: Will remain free from infection Outcome: Progressing   Problem: Activity: Goal: Risk for activity intolerance will decrease Outcome: Progressing   Problem: Coping: Goal: Level of anxiety will decrease Outcome: Progressing   Problem: Pain Management: Goal: General experience of comfort will improve Outcome: Progressing

## 2023-04-02 NOTE — Progress Notes (Signed)
Progress Note: General Surgery Service   Chief Complaint/Subjective: Had some worsening tachycardia while experiencing pain from his scrotum. Workup unremarkable and he feels more comfortable this morning. Endorsing some itching from his incisions and lower abdomen.    Objective: Vital signs in last 24 hours: Temp:  [98.2 F (36.8 C)-99.1 F (37.3 C)] 98.3 F (36.8 C) (11/22 0538) Pulse Rate:  [94-115] 94 (11/22 0538) Resp:  [16-20] 20 (11/22 0538) BP: (120-155)/(73-92) 125/81 (11/22 0538) SpO2:  [91 %-95 %] 95 % (11/22 0538) Weight:  [107.2 kg] 107.2 kg (11/21 1958) Last BM Date : 03/29/23  Intake/Output from previous day: 11/21 0701 - 11/22 0700 In: 960 [P.O.:960] Out: -  Intake/Output this shift: No intake/output data recorded.  GI: Abd significant scrotal swelling with bruising  Lab Results: CBC  Recent Labs    04/01/23 1748 04/02/23 0522  WBC 12.4* 8.8  HGB 10.4* 9.4*  HCT 31.8* 29.9*  PLT 247 222   BMET Recent Labs    04/01/23 0526 04/02/23 0522  NA 136 137  K 3.6 3.9  CL 101 103  CO2 26 25  GLUCOSE 103* 108*  BUN 14 11  CREATININE 0.71 0.71  CALCIUM 8.4* 8.2*   PT/INR No results for input(s): "LABPROT", "INR" in the last 72 hours. ABG No results for input(s): "PHART", "HCO3" in the last 72 hours.  Invalid input(s): "PCO2", "PO2"  Anti-infectives: Anti-infectives (From admission, onward)    Start     Dose/Rate Route Frequency Ordered Stop   03/30/23 0600  ceFAZolin (ANCEF) IVPB 2g/100 mL premix        2 g 200 mL/hr over 30 Minutes Intravenous On call to O.R. 03/30/23 0541 03/30/23 0740       Medications: Scheduled Meds:  acetaminophen  650 mg Oral Q6H   docusate sodium  100 mg Oral BID   gabapentin  300 mg Oral TID   ketorolac  15 mg Intravenous Q8H   Continuous Infusions: PRN Meds:.HYDROmorphone (DILAUDID) injection, methocarbamol (ROBAXIN) injection, metoprolol tartrate, ondansetron (ZOFRAN) IV, mouth rinse, oxyCODONE, oxyCODONE,  prochlorperazine, simethicone  Assessment/Plan: ROBOTIC INCISIONAL HERNIA REPAIR WITH MESH BILATERAL POSTERIOR RECTUS MYOFASCIAL RELEASE XI ROBOTIC ASSISTED BILATERAL INGUINAL HERNIA REPAIR WITH MESH  Scrotal swelling appears stable. He is urinating but will monitor and keep PRN catheterization available if needed Added PRN benadryl for itching Plan to keep inpatient till moving bowels, pain control improves, scrotal swelling improves  Ice and supportive underwear for scrotum    LOS: 2 days   Moise Boring, MD  [facility] Use [email or web address] to contact on call provider  Daily Billing: [id number] - post op
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
## 2023-04-02 NOTE — Progress Notes (Signed)
Mobility Specialist - Progress Note  Pre-mobility: 105 bpm  HR,  During mobility: 115 bpm HR,  Post-mobility: 107 bpm HR,    04/02/23 0951  Mobility  Activity Ambulated independently in hallway;Ambulated independently to bathroom  Level of Assistance Independent after set-up  Assistive Device None  Distance Ambulated (ft) 450 ft  Range of Motion/Exercises Active  Activity Response Tolerated well  Mobility Referral Yes  $Mobility charge 1 Mobility  Mobility Specialist Start Time (ACUTE ONLY) 0930  Mobility Specialist Stop Time (ACUTE ONLY) 0951  Mobility Specialist Time Calculation (min) (ACUTE ONLY) 21 min   Pt was found in bed and agreeable to ambulate. No complaints with session. At EOS returned to bed with all needs met. Call bell in reach.  Billey Chang Mobility Specialist

## 2023-04-03 LAB — CBC
HCT: 33.4 % — ABNORMAL LOW (ref 39.0–52.0)
Hemoglobin: 9.9 g/dL — ABNORMAL LOW (ref 13.0–17.0)
MCH: 31 pg (ref 26.0–34.0)
MCHC: 29.6 g/dL — ABNORMAL LOW (ref 30.0–36.0)
MCV: 104.7 fL — ABNORMAL HIGH (ref 80.0–100.0)
Platelets: 199 10*3/uL (ref 150–400)
RBC: 3.19 MIL/uL — ABNORMAL LOW (ref 4.22–5.81)
RDW: 14.1 % (ref 11.5–15.5)
WBC: 8.3 10*3/uL (ref 4.0–10.5)
nRBC: 0.5 % — ABNORMAL HIGH (ref 0.0–0.2)

## 2023-04-03 LAB — BASIC METABOLIC PANEL
Anion gap: 16 — ABNORMAL HIGH (ref 5–15)
BUN: UNDETERMINED mg/dL (ref 6–20)
CO2: 14 mmol/L — ABNORMAL LOW (ref 22–32)
Calcium: 8.6 mg/dL — ABNORMAL LOW (ref 8.9–10.3)
Chloride: 112 mmol/L — ABNORMAL HIGH (ref 98–111)
Glucose, Bld: 85 mg/dL (ref 70–99)
Potassium: 5.4 mmol/L — ABNORMAL HIGH (ref 3.5–5.1)
Sodium: 142 mmol/L (ref 135–145)

## 2023-04-03 NOTE — Progress Notes (Signed)
Progress Note: General Surgery Service   Chief Complaint/Subjective: Hb stable.  Swelling stable to slight improved.  He has ben up and ambulating in the halls.  + BM yesterday.  He wants to leave the hospital today.    Objective: Vital signs in last 24 hours: Temp:  [98.4 F (36.9 C)-98.8 F (37.1 C)] 98.4 F (36.9 C) (11/23 0622) Pulse Rate:  [97-105] 97 (11/23 0622) Resp:  [18-19] 18 (11/23 0622) BP: (135-150)/(76-87) 138/83 (11/23 0622) SpO2:  [94 %-99 %] 99 % (11/23 0622) Last BM Date : 04/02/23  Intake/Output from previous day: 11/22 0701 - 11/23 0700 In: 1080 [P.O.:1080] Out: -  Intake/Output this shift: No intake/output data recorded.  GI: Abd significant scrotal swelling with bruising  Lab Results: CBC  Recent Labs    04/02/23 0522 04/03/23 0544  WBC 8.8 8.3  HGB 9.4* 9.9*  HCT 29.9* 33.4*  PLT 222 199   BMET Recent Labs    04/01/23 0526 04/02/23 0522  NA 136 137  K 3.6 3.9  CL 101 103  CO2 26 25  GLUCOSE 103* 108*  BUN 14 11  CREATININE 0.71 0.71  CALCIUM 8.4* 8.2*   PT/INR No results for input(s): "LABPROT", "INR" in the last 72 hours. ABG No results for input(s): "PHART", "HCO3" in the last 72 hours.  Invalid input(s): "PCO2", "PO2"  Anti-infectives: Anti-infectives (From admission, onward)    Start     Dose/Rate Route Frequency Ordered Stop   03/30/23 0600  ceFAZolin (ANCEF) IVPB 2g/100 mL premix        2 g 200 mL/hr over 30 Minutes Intravenous On call to O.R. 03/30/23 0541 03/30/23 0740       Medications: Scheduled Meds:  acetaminophen  650 mg Oral Q6H   docusate sodium  100 mg Oral BID   gabapentin  300 mg Oral TID   ketorolac  15 mg Intravenous Q8H   polyethylene glycol  17 g Oral Daily   Continuous Infusions: PRN Meds:.diphenhydrAMINE, HYDROmorphone (DILAUDID) injection, menthol-cetylpyridinium, methocarbamol (ROBAXIN) injection, metoprolol tartrate, ondansetron (ZOFRAN) IV, mouth rinse, oxyCODONE, oxyCODONE,  prochlorperazine, simethicone  Assessment/Plan: ROBOTIC INCISIONAL HERNIA REPAIR WITH MESH BILATERAL POSTERIOR RECTUS MYOFASCIAL RELEASE XI ROBOTIC ASSISTED BILATERAL INGUINAL HERNIA REPAIR WITH MESH  Scrotal swelling appears stable. + BM yesterday Plan for discharge today.    Ice and supportive underwear for scrotum    LOS: 3 days   Moise Boring, MD  [facility] Use [email or web address] to contact on call provider  Daily Billing: [id number] - post op

## 2023-04-03 NOTE — Plan of Care (Signed)
  Problem: Clinical Measurements: Goal: Will remain free from infection Outcome: Progressing   Problem: Coping: Goal: Level of anxiety will decrease Outcome: Progressing   Problem: Pain Management: Goal: General experience of comfort will improve Outcome: Progressing   Problem: Activity: Goal: Risk for activity intolerance will decrease Outcome: Adequate for Discharge   Problem: Nutrition: Goal: Adequate nutrition will be maintained Outcome: Adequate for Discharge

## 2023-04-03 NOTE — Progress Notes (Signed)
Patient received discharge orders to go home. Patient was given discharge paperwork/instructions. RN went over discharge paperwork/instructions with the patient. All questions/concerns were answered/addressed during this time. Patient left the hospital stable, had discharge paperwork/instructions, and had all personal belongs.

## 2023-04-05 ENCOUNTER — Other Ambulatory Visit (HOSPITAL_COMMUNITY): Payer: Self-pay

## 2023-04-10 ENCOUNTER — Other Ambulatory Visit (HOSPITAL_COMMUNITY): Payer: Self-pay
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
